# Patient Record
Sex: Female | Born: 1960 | Race: White | Hispanic: No | Marital: Married | Smoking: Never smoker
Health system: Southern US, Community
[De-identification: ages and names within clinical notes are randomized; demographics above are authoritative.]

## PROBLEM LIST (undated history)

## (undated) DIAGNOSIS — R7303 Prediabetes: Secondary | ICD-10-CM

## (undated) DIAGNOSIS — I10 Essential (primary) hypertension: Secondary | ICD-10-CM

## (undated) DIAGNOSIS — E079 Disorder of thyroid, unspecified: Secondary | ICD-10-CM

## (undated) DIAGNOSIS — E785 Hyperlipidemia, unspecified: Secondary | ICD-10-CM

## (undated) DIAGNOSIS — H409 Unspecified glaucoma: Secondary | ICD-10-CM

## (undated) DIAGNOSIS — I2699 Other pulmonary embolism without acute cor pulmonale: Secondary | ICD-10-CM

## (undated) DIAGNOSIS — F419 Anxiety disorder, unspecified: Secondary | ICD-10-CM

## (undated) DIAGNOSIS — K219 Gastro-esophageal reflux disease without esophagitis: Secondary | ICD-10-CM

## (undated) DIAGNOSIS — Z8719 Personal history of other diseases of the digestive system: Secondary | ICD-10-CM

## (undated) DIAGNOSIS — F429 Obsessive-compulsive disorder, unspecified: Secondary | ICD-10-CM

## (undated) DIAGNOSIS — E039 Hypothyroidism, unspecified: Secondary | ICD-10-CM

## (undated) DIAGNOSIS — R1310 Dysphagia, unspecified: Secondary | ICD-10-CM

## (undated) HISTORY — DX: Anxiety disorder, unspecified: F41.9

## (undated) HISTORY — PX: CAROTID ENDARTERECTOMY: SUR193

## (undated) HISTORY — PX: APPENDECTOMY: SHX54

## (undated) HISTORY — DX: Disorder of thyroid, unspecified: E07.9

## (undated) HISTORY — DX: Other pulmonary embolism without acute cor pulmonale: I26.99

## (undated) HISTORY — PX: TUBAL LIGATION: SHX77

## (undated) HISTORY — PX: DILATION AND CURETTAGE OF UTERUS: SHX78

## (undated) HISTORY — PX: ABDOMINAL HYSTERECTOMY: SHX81

## (undated) HISTORY — PX: CHOLECYSTECTOMY: SHX55

---

## 2004-08-20 ENCOUNTER — Ambulatory Visit: Payer: Self-pay | Admitting: Unknown Physician Specialty

## 2004-08-26 ENCOUNTER — Ambulatory Visit: Payer: Self-pay | Admitting: Unknown Physician Specialty

## 2005-09-01 ENCOUNTER — Ambulatory Visit: Payer: Self-pay | Admitting: Unknown Physician Specialty

## 2006-09-05 ENCOUNTER — Ambulatory Visit: Payer: Self-pay | Admitting: Unknown Physician Specialty

## 2006-09-07 ENCOUNTER — Ambulatory Visit: Payer: Self-pay | Admitting: Unknown Physician Specialty

## 2006-09-12 ENCOUNTER — Ambulatory Visit: Payer: Self-pay | Admitting: Unknown Physician Specialty

## 2006-10-11 ENCOUNTER — Encounter: Payer: Self-pay | Admitting: Unknown Physician Specialty

## 2006-10-24 ENCOUNTER — Ambulatory Visit: Payer: Self-pay | Admitting: Unknown Physician Specialty

## 2006-11-08 ENCOUNTER — Encounter: Payer: Self-pay | Admitting: Unknown Physician Specialty

## 2007-03-06 ENCOUNTER — Ambulatory Visit: Payer: Self-pay | Admitting: Unknown Physician Specialty

## 2007-10-11 ENCOUNTER — Ambulatory Visit: Payer: Self-pay | Admitting: Unknown Physician Specialty

## 2008-10-23 ENCOUNTER — Ambulatory Visit: Payer: Self-pay | Admitting: Unknown Physician Specialty

## 2008-11-04 ENCOUNTER — Ambulatory Visit: Payer: Self-pay | Admitting: Unknown Physician Specialty

## 2008-11-06 ENCOUNTER — Ambulatory Visit: Payer: Self-pay | Admitting: Unknown Physician Specialty

## 2008-11-10 ENCOUNTER — Ambulatory Visit: Payer: Self-pay | Admitting: Unknown Physician Specialty

## 2008-11-11 ENCOUNTER — Ambulatory Visit: Payer: Self-pay | Admitting: Cardiology

## 2009-01-27 ENCOUNTER — Ambulatory Visit: Payer: Self-pay | Admitting: Internal Medicine

## 2009-01-29 ENCOUNTER — Ambulatory Visit: Payer: Self-pay | Admitting: Internal Medicine

## 2009-03-12 ENCOUNTER — Ambulatory Visit: Payer: Self-pay | Admitting: Internal Medicine

## 2009-05-16 ENCOUNTER — Emergency Department: Payer: Self-pay | Admitting: Emergency Medicine

## 2009-12-30 ENCOUNTER — Ambulatory Visit: Payer: Self-pay | Admitting: Unknown Physician Specialty

## 2010-01-07 ENCOUNTER — Ambulatory Visit: Payer: Self-pay | Admitting: Family Medicine

## 2010-08-25 ENCOUNTER — Emergency Department: Payer: Self-pay | Admitting: Emergency Medicine

## 2010-09-04 ENCOUNTER — Ambulatory Visit: Payer: Self-pay | Admitting: Sports Medicine

## 2010-09-07 ENCOUNTER — Encounter: Payer: Self-pay | Admitting: Sports Medicine

## 2010-09-08 ENCOUNTER — Encounter: Payer: Self-pay | Admitting: Sports Medicine

## 2011-01-13 ENCOUNTER — Ambulatory Visit: Payer: Self-pay | Admitting: Unknown Physician Specialty

## 2011-05-26 ENCOUNTER — Ambulatory Visit: Payer: Self-pay | Admitting: Obstetrics and Gynecology

## 2011-12-01 ENCOUNTER — Other Ambulatory Visit: Payer: Self-pay | Admitting: Physician Assistant

## 2011-12-01 ENCOUNTER — Ambulatory Visit: Payer: Self-pay

## 2012-01-19 ENCOUNTER — Ambulatory Visit: Payer: Self-pay | Admitting: Obstetrics and Gynecology

## 2012-01-20 ENCOUNTER — Ambulatory Visit: Payer: Self-pay | Admitting: Obstetrics and Gynecology

## 2012-01-21 ENCOUNTER — Ambulatory Visit: Payer: Self-pay | Admitting: Unknown Physician Specialty

## 2012-04-04 ENCOUNTER — Ambulatory Visit: Payer: Self-pay | Admitting: Podiatry

## 2012-07-25 ENCOUNTER — Ambulatory Visit: Payer: Self-pay | Admitting: Obstetrics and Gynecology

## 2012-07-26 ENCOUNTER — Ambulatory Visit: Payer: Self-pay | Admitting: Internal Medicine

## 2013-05-10 ENCOUNTER — Ambulatory Visit: Payer: Self-pay | Admitting: Unknown Physician Specialty

## 2013-06-14 ENCOUNTER — Ambulatory Visit: Payer: Self-pay | Admitting: Internal Medicine

## 2013-06-26 ENCOUNTER — Emergency Department: Payer: Self-pay | Admitting: Emergency Medicine

## 2013-06-26 LAB — BASIC METABOLIC PANEL
ANION GAP: 6 — AB (ref 7–16)
BUN: 14 mg/dL (ref 7–18)
CO2: 27 mmol/L (ref 21–32)
Calcium, Total: 8.7 mg/dL (ref 8.5–10.1)
Chloride: 107 mmol/L (ref 98–107)
Creatinine: 0.98 mg/dL (ref 0.60–1.30)
EGFR (African American): >60
EGFR (Non-African Amer.): >60
Glucose: 133 mg/dL — ABNORMAL HIGH (ref 65–99)
Osmolality: 282 (ref 275–301)
POTASSIUM: 3.8 mmol/L (ref 3.5–5.1)
Sodium: 140 mmol/L (ref 136–145)

## 2013-06-26 LAB — CBC
HCT: 38.3 % (ref 35.0–47.0)
HGB: 12.6 g/dL (ref 12.0–16.0)
MCH: 30.1 pg (ref 26.0–34.0)
MCHC: 32.8 g/dL (ref 32.0–36.0)
MCV: 92 fL (ref 80–100)
Platelet: 241 10*3/uL (ref 150–440)
RBC: 4.18 10*6/uL (ref 3.80–5.20)
RDW: 14 % (ref 11.5–14.5)
WBC: 11 10*3/uL (ref 3.6–11.0)

## 2013-06-26 LAB — PROTIME-INR
INR: 0.9
Prothrombin Time: 12.5 secs (ref 11.5–14.7)

## 2013-06-26 LAB — D-DIMER(ARMC): D-Dimer: 664 ng/ml

## 2013-06-26 LAB — TROPONIN I

## 2013-06-27 LAB — TROPONIN I: Troponin-I: <0.02 ng/mL

## 2013-07-12 ENCOUNTER — Ambulatory Visit: Payer: Self-pay | Admitting: Internal Medicine

## 2013-09-19 DIAGNOSIS — G4733 Obstructive sleep apnea (adult) (pediatric): Secondary | ICD-10-CM | POA: Insufficient documentation

## 2013-11-01 ENCOUNTER — Ambulatory Visit: Payer: Self-pay | Admitting: Obstetrics and Gynecology

## 2013-11-13 ENCOUNTER — Ambulatory Visit: Payer: Self-pay | Admitting: Internal Medicine

## 2013-11-27 ENCOUNTER — Ambulatory Visit: Payer: Self-pay | Admitting: Internal Medicine

## 2013-12-08 ENCOUNTER — Ambulatory Visit: Payer: Self-pay | Admitting: Internal Medicine

## 2014-06-02 IMAGING — CT CT ANGIO CHEST
2 of 6 series · 18 of 36 positions shown · IV contrast (isovue)
Comparison: DG CHEST 2V dated 06/26/2013; CT CHEST W/ CM dated
12/01/2011

CLINICAL DATA: Shortness of breath, elevated D-dimer

EXAM:
CT ANGIOGRAPHY CHEST WITH CONTRAST
TECHNIQUE: Multidetector CT imaging of the chest was performed using the
standard protocol during bolus administration of intravenous
contrast. Multiplanar CT image reconstructions and MIPs were
obtained to evaluate the vascular anatomy.
CONTRAST:  100 mL Isovue 370

[Series 7: cor pe 2.0 mpr · coronal · 0.61mm/px · 1 of 93 slices shown]
[im 47/93  mediastinal]
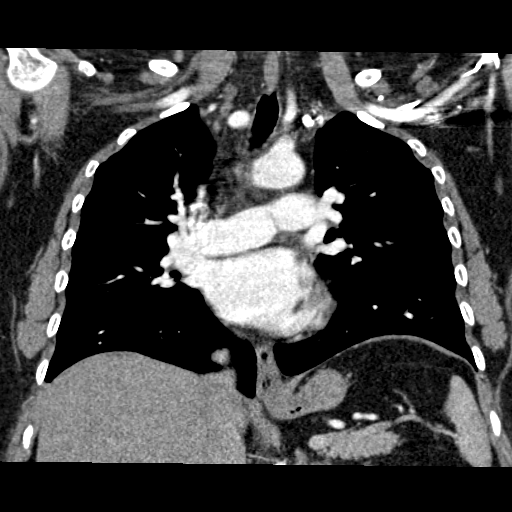

[Series 11: pe 1.0 thins - · axial · 0.64mm/px · z∈[-299,-75]mm · 17 of 253 slices shown]
[im 15/253  lung]
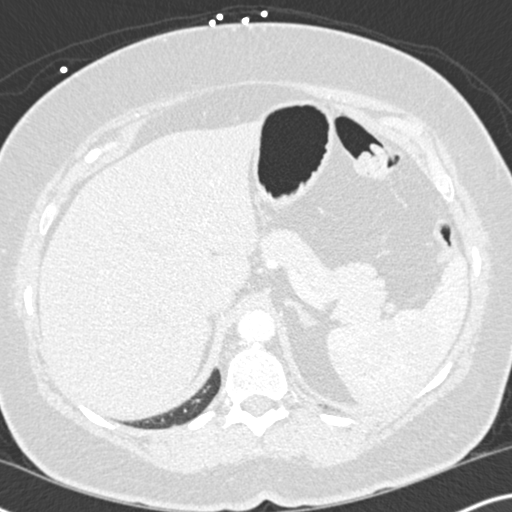
[im 29/253  mediastinal]
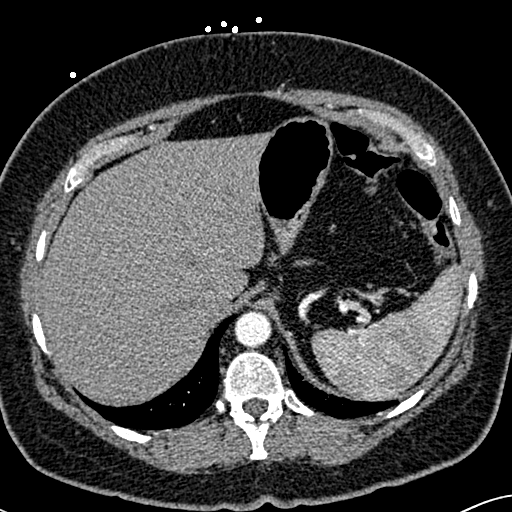
[im 43/253  lung]
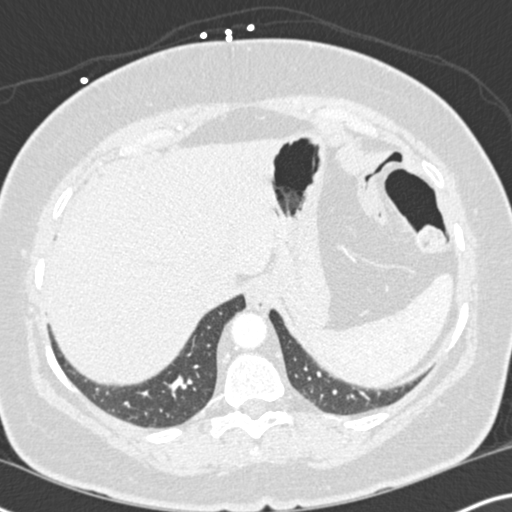
[im 57/253  mediastinal]
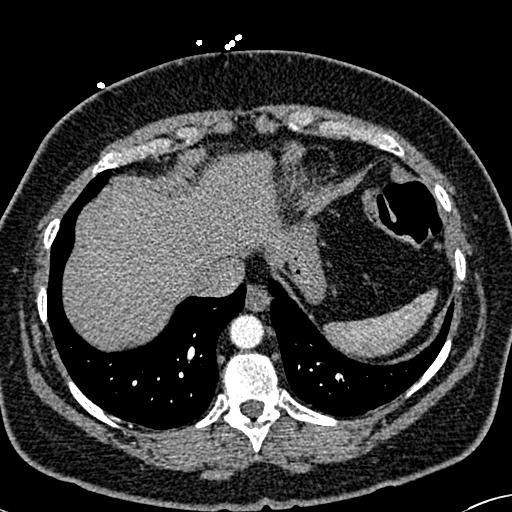
[im 71/253  lung]
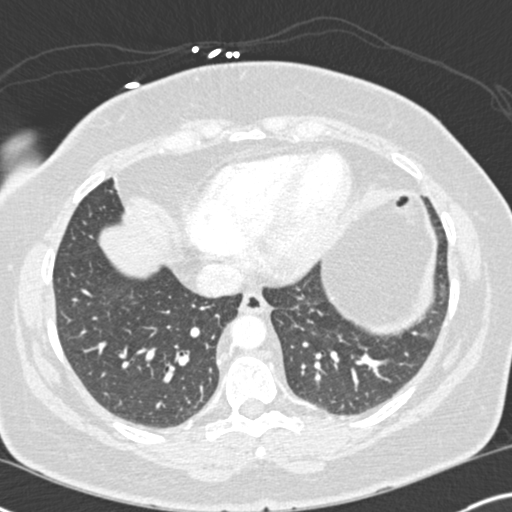
[im 85/253  mediastinal]
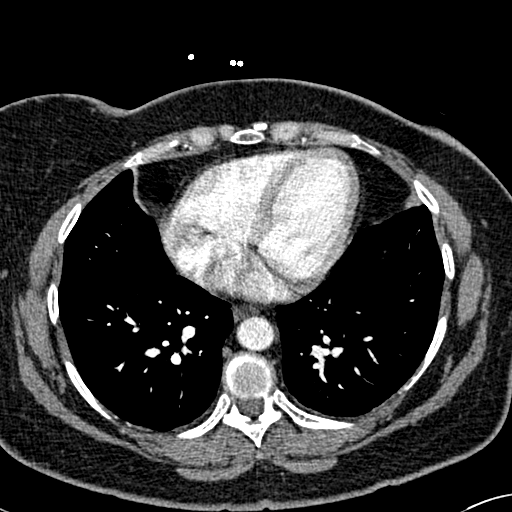
[im 99/253  lung]
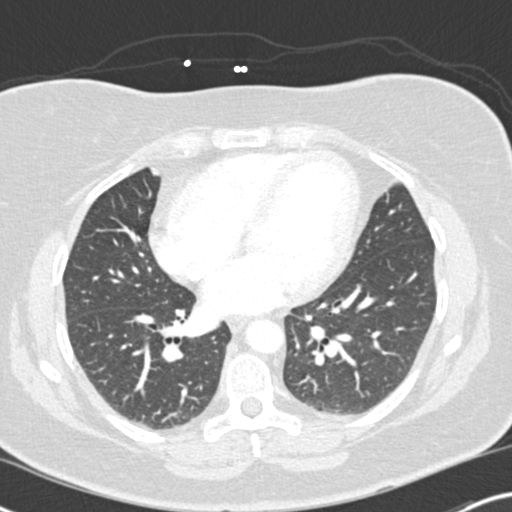
[im 113/253  mediastinal]
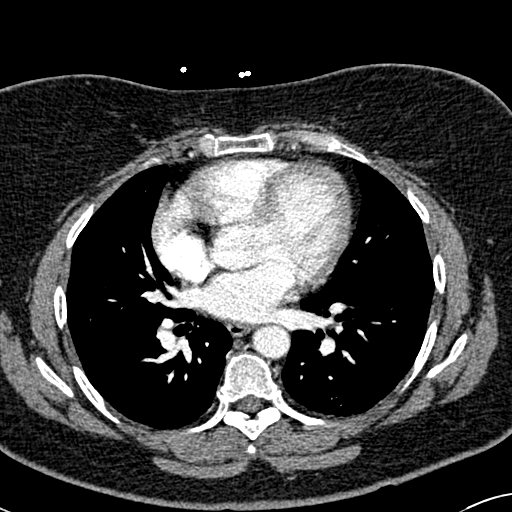
[im 127/253  lung]
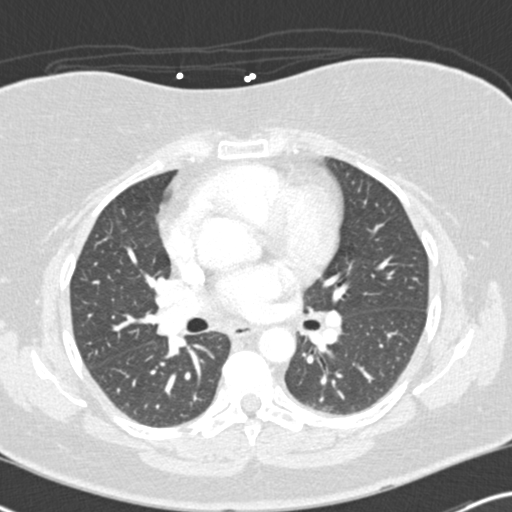
[im 141/253  mediastinal]
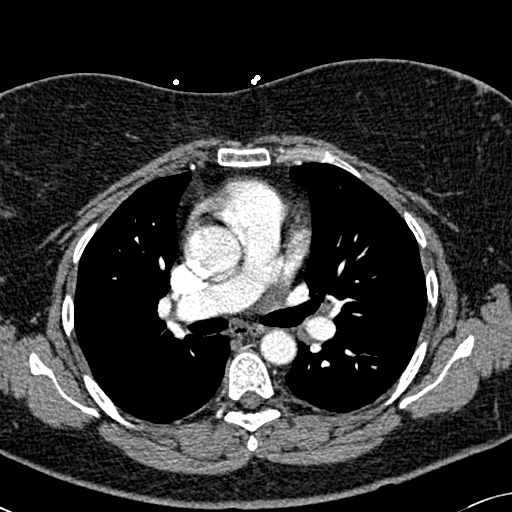
[im 155/253  lung]
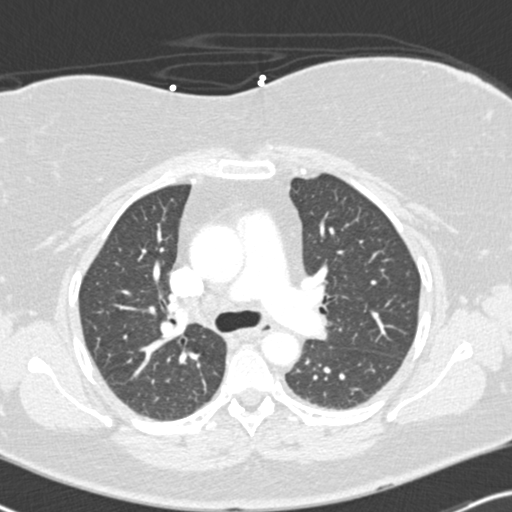
[im 169/253  mediastinal]
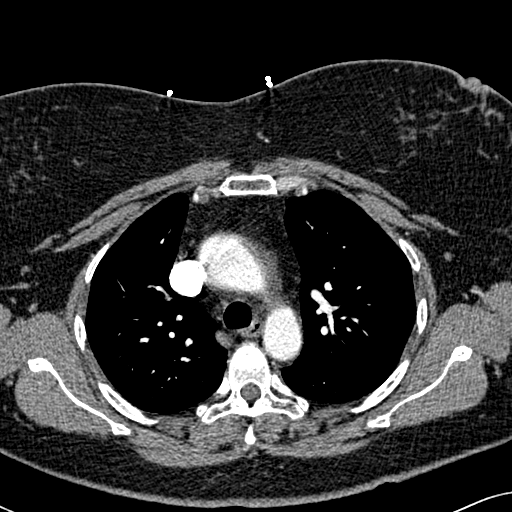
[im 183/253  lung]
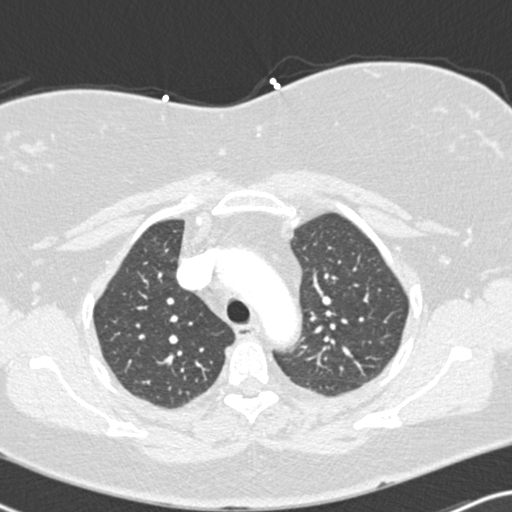
[im 197/253  mediastinal]
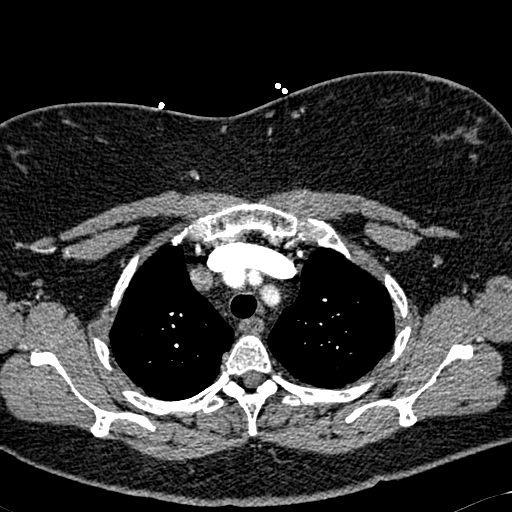
[im 211/253  lung]
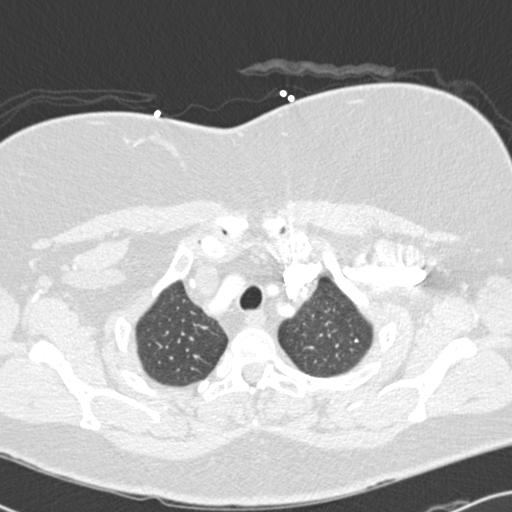
[im 225/253  mediastinal]
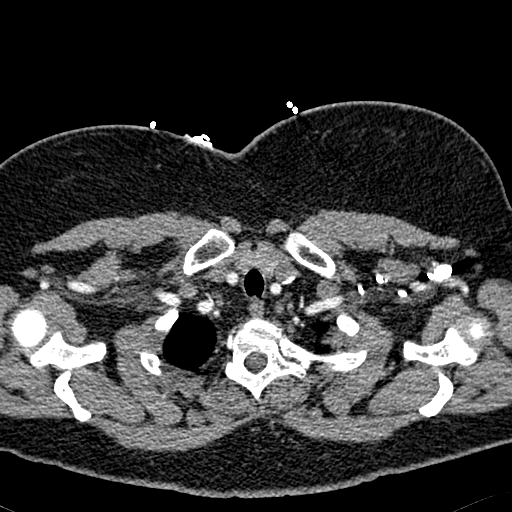
[im 239/253  lung]
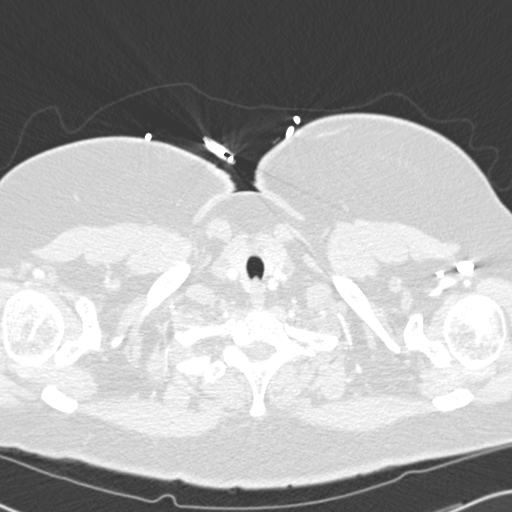

[18 of 36 positions shown; findings below may reference images not displayed]

FINDINGS: The thoracic inlet is unremarkable.

No mediastinal or hilar masses or adenopathy.

There no filling defects within the main, lobar, or segmental
pulmonary arteries. There is no evidence of a thoracic aortic
aneurysm nor dissection.

Minimal scarring versus atelectasis within the lung bases. The lungs
otherwise clear. The central airways are patent.

The visualized upper abdominal viscera are unremarkable.

No aggressive appearing osseous lesions.

Review of the MIP images confirms the above findings.
IMPRESSION: 1. No CT evidence of pulmonary arterial embolic disease.
2. No focal or acute intrathoracic abnormalities.

## 2014-06-11 ENCOUNTER — Other Ambulatory Visit: Payer: Self-pay | Admitting: Unknown Physician Specialty

## 2014-06-11 DIAGNOSIS — R1012 Left upper quadrant pain: Secondary | ICD-10-CM

## 2014-06-11 DIAGNOSIS — K5900 Constipation, unspecified: Secondary | ICD-10-CM

## 2014-06-12 ENCOUNTER — Ambulatory Visit
Admission: RE | Admit: 2014-06-12 | Discharge: 2014-06-12 | Disposition: A | Discharge status: Home / Self Care | Payer: Managed Care, Other (non HMO) | Source: Ambulatory Visit | Attending: Unknown Physician Specialty | Admitting: Unknown Physician Specialty

## 2014-06-12 DIAGNOSIS — K5900 Constipation, unspecified: Secondary | ICD-10-CM

## 2014-06-12 DIAGNOSIS — R1012 Left upper quadrant pain: Secondary | ICD-10-CM | POA: Insufficient documentation

## 2014-06-12 MED ORDER — IOHEXOL 300 MG/ML  SOLN
100.0000 mL | Freq: Once | INTRAMUSCULAR | Status: AC | PRN
  Administered 2014-06-12: 100 mL via INTRAVENOUS

## 2014-07-02 ENCOUNTER — Encounter
Admission: RE | Disposition: A | Discharge status: Home / Self Care | Payer: Self-pay | Source: Ambulatory Visit | Attending: Unknown Physician Specialty

## 2014-07-02 ENCOUNTER — Ambulatory Visit: Payer: Managed Care, Other (non HMO) | Admitting: Anesthesiology

## 2014-07-02 ENCOUNTER — Encounter: Payer: Self-pay | Admitting: *Deleted

## 2014-07-02 ENCOUNTER — Ambulatory Visit
Admission: RE | Admit: 2014-07-02 | Discharge: 2014-07-02 | Disposition: A | Discharge status: Home / Self Care | Payer: Managed Care, Other (non HMO) | Source: Ambulatory Visit | Attending: Unknown Physician Specialty | Admitting: Unknown Physician Specialty

## 2014-07-02 DIAGNOSIS — Z800 Family history of malignant neoplasm of digestive organs: Secondary | ICD-10-CM | POA: Diagnosis not present

## 2014-07-02 DIAGNOSIS — Z7982 Long term (current) use of aspirin: Secondary | ICD-10-CM | POA: Insufficient documentation

## 2014-07-02 DIAGNOSIS — Z1211 Encounter for screening for malignant neoplasm of colon: Secondary | ICD-10-CM | POA: Diagnosis present

## 2014-07-02 DIAGNOSIS — R1012 Left upper quadrant pain: Secondary | ICD-10-CM | POA: Insufficient documentation

## 2014-07-02 DIAGNOSIS — Z79899 Other long term (current) drug therapy: Secondary | ICD-10-CM | POA: Insufficient documentation

## 2014-07-02 DIAGNOSIS — K2970 Gastritis, unspecified, without bleeding: Secondary | ICD-10-CM | POA: Insufficient documentation

## 2014-07-02 DIAGNOSIS — K648 Other hemorrhoids: Secondary | ICD-10-CM | POA: Insufficient documentation

## 2014-07-02 DIAGNOSIS — M797 Fibromyalgia: Secondary | ICD-10-CM | POA: Diagnosis not present

## 2014-07-02 HISTORY — PX: COLONOSCOPY: SHX5424

## 2014-07-02 HISTORY — PX: ESOPHAGOGASTRODUODENOSCOPY: SHX5428

## 2014-07-02 SURGERY — COLONOSCOPY
Anesthesia: General

## 2014-07-02 MED ORDER — PROPOFOL INFUSION 10 MG/ML OPTIME
INTRAVENOUS | Status: DC | PRN
  Administered 2014-07-02: 200 ug/kg/min via INTRAVENOUS

## 2014-07-02 MED ORDER — MIDAZOLAM HCL 5 MG/5ML IJ SOLN
INTRAMUSCULAR | Status: DC | PRN
  Administered 2014-07-02: 1 mg via INTRAVENOUS

## 2014-07-02 MED ORDER — SODIUM CHLORIDE 0.9 % IV SOLN: INTRAVENOUS | Status: DC

## 2014-07-02 MED ORDER — LIDOCAINE HCL (PF) 2 % IJ SOLN
INTRAMUSCULAR | Status: DC | PRN
  Administered 2014-07-02: 50 mg

## 2014-07-02 MED ORDER — LACTATED RINGERS IV SOLN
INTRAVENOUS | Status: DC
  Administered 2014-07-02: 1000 mL via INTRAVENOUS

## 2014-07-02 MED ORDER — LIDOCAINE HCL (PF) 1 % IJ SOLN
INTRAMUSCULAR | Status: AC
Start: 2014-07-02 — End: 2014-07-02
  Administered 2014-07-02: 2 mL
  Filled 2014-07-02: qty 2

## 2014-07-02 MED ORDER — FENTANYL CITRATE (PF) 100 MCG/2ML IJ SOLN
INTRAMUSCULAR | Status: DC | PRN
  Administered 2014-07-02: 50 ug via INTRAVENOUS

## 2014-07-02 MED ORDER — PHENYLEPHRINE HCL 10 MG/ML IJ SOLN
INTRAMUSCULAR | Status: DC | PRN
  Administered 2014-07-02 (×2): 100 ug via INTRAVENOUS
  Administered 2014-07-02: 200 ug via INTRAVENOUS

## 2014-07-02 MED ORDER — GLYCOPYRROLATE 0.2 MG/ML IJ SOLN
INTRAMUSCULAR | Status: DC | PRN
  Administered 2014-07-02: 0.2 mg via INTRAVENOUS

## 2014-07-02 MED ORDER — PROPOFOL 10 MG/ML IV BOLUS
INTRAVENOUS | Status: DC | PRN
  Administered 2014-07-02: 50 mg via INTRAVENOUS

## 2014-07-02 NOTE — H&P (Signed)
   Primary Care Physician:  Marguarite ArbourSPARKS,JEFFREY D, MD Primary Gastroenterologist:  Dr. Mechele CollinElliott  Pre-Procedure History & Physical: HPI:  Sydney Soto is a 54 y.o. female is here for an endoscopy and colonoscopy.   History reviewed. No pertinent past medical history.  History reviewed. No pertinent past surgical history.  Prior to Admission medications   Medication Sig Start Date End Date Taking? Authorizing Provider  acetaminophen (TYLENOL) 500 MG tablet Take 1,000 mg by mouth every 6 (six) hours as needed for moderate pain.   Yes Historical Provider, MD  aspirin 325 MG tablet Take 325 mg by mouth daily.   Yes Historical Provider, MD  cyanocobalamin (,VITAMIN B-12,) 1000 MCG/ML injection Inject 1 mL into the muscle every 30 (thirty) days. 06/11/14  Yes Historical Provider, MD  DULoxetine (CYMBALTA) 30 MG capsule Take 30-60 mg by mouth 2 (two) times daily. 2 cap in the morning and 1 cap at night   Yes Historical Provider, MD  magnesium oxide (MAG-OX) 400 MG tablet Take 400 mg by mouth daily.   Yes Historical Provider, MD  naproxen sodium (ANAPROX) 220 MG tablet Take 220 mg by mouth 2 (two) times daily as needed (pain).   Yes Historical Provider, MD  pregabalin (LYRICA) 50 MG capsule Take 1 capsule by mouth at bedtime. 03/17/14  Yes Historical Provider, MD  TURMERIC PO Take 1 tablet by mouth daily.   Yes Historical Provider, MD  Vitamin D, Ergocalciferol, (DRISDOL) 50000 UNITS CAPS capsule Take 5,000 Units by mouth daily.   Yes Historical Provider, MD    Allergies as of 06/23/2014 - Review Complete 06/12/2014  Allergen Reaction Noted  . Erythromycin Nausea Only 06/12/2014    History reviewed. No pertinent family history. Family history of colon cancer in first degree relative  History   Social History  . Marital Status: Married    Spouse Name: N/A  . Number of Children: N/A  . Years of Education: N/A   Occupational History  . Not on file.   Social History Main Topics  . Smoking  status: Not on file  . Smokeless tobacco: Not on file  . Alcohol Use: Not on file  . Drug Use: Not on file  . Sexual Activity: Not on file   Other Topics Concern  . Not on file   Social History Narrative    Review of Systems: See HPI, otherwise negative ROS  Physical Exam: BP 133/85 mmHg  Pulse 86  Temp(Src) 97.1 F (36.2 C) (Tympanic)  Resp 18  Ht 5\' 5"  (1.651 m)  Wt 100.699 kg (222 lb)  BMI 36.94 kg/m2 General:   Alert,  pleasant and cooperative in NAD Head:  Normocephalic and atraumatic. Neck:  Supple; no masses or thyromegaly. Lungs:  Clear throughout to auscultation.    Heart:  Regular rate and rhythm. Abdomen:  Soft, nontender and nondistended. Normal bowel sounds, without guarding, and without rebound.   Neurologic:  Alert and  oriented x4;  grossly normal neurologically.  Impression/Plan: Sydney Soto is here for an endoscopy and colonoscopy to be performed Abdominal pain and family history of colon cancer  Risks, benefits, limitations, and alternatives regarding  endoscopy and colonoscopy have been reviewed with the patient.  Questions have been answered.  All parties agreeable.   Lynnae PrudeELLIOTT, Onie Kasparek, MD  07/02/2014, 1:30 PM

## 2014-07-02 NOTE — Transfer of Care (Signed)
Immediate Anesthesia Transfer of Care Note  Patient: Sydney Soto CanaryH Groman  Procedure(s) Performed: Procedure(s): COLONOSCOPY (N/A) ESOPHAGOGASTRODUODENOSCOPY (EGD) (N/A)  Patient Location: PACU  Anesthesia Type:General  Level of Consciousness: sedated  Airway & Oxygen Therapy: Patient Spontanous Breathing and Patient connected to nasal cannula oxygen  Post-op Assessment: Report given to RN  Post vital signs: Reviewed and stable  Last Vitals:  Filed Vitals:   07/02/14 1250  BP: 133/85  Pulse: 86  Temp: 36.2 C  Resp: 18    Complications: No apparent anesthesia complications

## 2014-07-02 NOTE — Op Note (Signed)
[facility] Gastroenterology Patient Name: Sydney SavageCatherene Soto Procedure Date: 07/02/2014 12:57 PM MRN: [id number] Account #: [id number] Date of Birth: August 17, 1960 Admit Type: Outpatient Age: [age] [facility] 3 Gender: Female Note Status: Finalized Procedure:         Upper GI endoscopy Indications:       Abdominal pain in the left upper quadrant Providers:         Scot Junobert T. Elliott, MD Referring MD:      Duane LopeJeffrey D. Judithann SheenSparks, MD (Referring MD) Medicines:         Propofol per Anesthesia Complications:     No immediate complications. Procedure:         Pre-Anesthesia Assessment:                    - After reviewing the risks and benefits, the patient was                     deemed in satisfactory condition to undergo the procedure.                    After obtaining informed consent, the endoscope was passed                     under direct vision. Throughout the procedure, the                     patient's blood pressure, pulse, and oxygen saturations                     were monitored continuously. The Endoscope was introduced                     through the mouth, and advanced to the second part of                     duodenum. The upper GI endoscopy was accomplished without                     difficulty. The patient tolerated the procedure well. Findings:      The examined esophagus was normal.      Patchy mildly erythematous mucosa without bleeding was found in the       gastric antrum. Biopsies were taken with a cold forceps for histology.       also from the body.      Diffuse mild inflammation characterized by erythema and granularity was       found in the duodenal bulb. Biopsies were taken with a cold forceps for       histology. Biopsies for histology were taken with a cold forceps for for       evaluation of celiac disease. Impression:        - Normal esophagus.                    - Erythematous mucosa in the antrum. Biopsied.                    -  Duodenitis. Biopsied. Recommendation:    - Await pathology results. Scot Junobert T Elliott, MD 07/02/2014 1:48:21 PM This report has been signed electronically. Number of Addenda: 0 Note Initiated On: 07/02/2014 12:57 PM      [facility]

## 2014-07-02 NOTE — Anesthesia Preprocedure Evaluation (Addendum)
Anesthesia Evaluation  Patient identified by MRN, date of birth, ID band Patient awake    Reviewed: Allergy & Precautions, NPO status , Patient's Chart, lab work & pertinent test results  Airway Mallampati: III       Dental no notable dental hx. (+) Teeth Intact   Pulmonary neg pulmonary ROS,    Pulmonary exam normal       Cardiovascular negative cardio ROS  Rate:Tachycardia     Neuro/Psych Anxiety negative neurological ROS  negative psych ROS   GI/Hepatic negative GI ROS, Neg liver ROS,   Endo/Other  Morbid obesity  Renal/GU negative Renal ROS  negative genitourinary   Musculoskeletal negative musculoskeletal ROS (+)   Abdominal Normal abdominal exam  (+)   Peds negative pediatric ROS (+)  Hematology   Anesthesia Other Findings   Reproductive/Obstetrics negative OB ROS                            Anesthesia Physical Anesthesia Plan  ASA: III  Anesthesia Plan: General   Post-op Pain Management:    Induction: Intravenous  Airway Management Planned: Nasal Cannula  Additional Equipment:   Intra-op Plan:   Post-operative Plan:   Informed Consent: I have reviewed the patients History and Physical, chart, labs and discussed the procedure including the risks, benefits and alternatives for the proposed anesthesia with the patient or authorized representative who has indicated his/her understanding and acceptance.     Plan Discussed with: CRNA  Anesthesia Plan Comments:         Anesthesia Quick Evaluation

## 2014-07-02 NOTE — Anesthesia Postprocedure Evaluation (Signed)
  Anesthesia Post-op Note  Patient: Sydney Soto  Procedure(s) Performed: Procedure(s): COLONOSCOPY (N/A) ESOPHAGOGASTRODUODENOSCOPY (EGD) (N/A)  Anesthesia type:General  Patient location: PACU  Post pain: Pain level controlled  Post assessment: Post-op Vital signs reviewed, Patient's Cardiovascular Status Stable, Respiratory Function Stable, Patent Airway and No signs of Nausea or vomiting  Post vital signs: Reviewed and stable  Last Vitals:  Filed Vitals:   07/02/14 1250  BP: 133/85  Pulse: 86  Temp: 36.2 C  Resp: 18    Level of consciousness: awake, alert  and patient cooperative  Complications: No apparent anesthesia complications

## 2014-07-02 NOTE — Op Note (Signed)
[facility] Gastroenterology Patient Name: Sydney Soto Procedure Date: 07/02/2014 12:56 PM MRN: [id number] Account #: [id number] Date of Birth: 11-09-1960 Admit Type: Outpatient Age: 54 Room: [facility] ENDO ROOM 3 Gender: Female Note Status: Finalized Procedure:         Colonoscopy Indications:       Screening in patient at increased risk: Family history of                     1st-degree relative with colorectal cancer before age 34                     years Providers:         Scot Jun, MD Referring MD:      Duane Lope. Judithann Sheen, MD (Referring MD) Medicines:         Propofol per Anesthesia Complications:     No immediate complications. Procedure:         Pre-Anesthesia Assessment:                    - After reviewing the risks and benefits, the patient was                     deemed in satisfactory condition to undergo the procedure.                    After obtaining informed consent, the colonoscope was                     passed under direct vision. Throughout the procedure, the                     patient's blood pressure, pulse, and oxygen saturations                     were monitored continuously. The Olympus PCF-H180AL                     colonoscope ( S#: [id number] ) was introduced through the                     anus and advanced to the the cecum, identified by                     appendiceal orifice and ileocecal valve. The colonoscopy                     was performed without difficulty. The patient tolerated                     the procedure well. The quality of the bowel preparation                     was excellent. Findings:      Internal hemorrhoids were found during endoscopy. The hemorrhoids were       small. Prep was normal.      The exam was otherwise without abnormality. Impression:        - Internal hemorrhoids.                    - The examination was otherwise normal.                    - No specimens collected. Recommendation:     - Repeat colonoscopy  in 5 years for screening purposes. Scot Junobert T Devontre Siedschlag, MD 07/02/2014 2:11:06 PM This report has been signed electronically. Number of Addenda: 0 Note Initiated On: 07/02/2014 12:56 PM Scope Withdrawal Time: 0 hours 15 minutes 22 seconds  Total Procedure Duration: 0 hours 18 minutes 9 seconds       [facility]

## 2014-07-03 ENCOUNTER — Encounter: Payer: Self-pay | Admitting: Unknown Physician Specialty

## 2014-07-08 ENCOUNTER — Other Ambulatory Visit: Payer: Self-pay | Admitting: Obstetrics and Gynecology

## 2014-07-08 DIAGNOSIS — N63 Unspecified lump in unspecified breast: Secondary | ICD-10-CM

## 2014-07-16 ENCOUNTER — Ambulatory Visit
Admission: RE | Admit: 2014-07-16 | Discharge: 2014-07-16 | Disposition: A | Discharge status: Home / Self Care | Payer: Managed Care, Other (non HMO) | Source: Ambulatory Visit | Attending: Obstetrics and Gynecology | Admitting: Obstetrics and Gynecology

## 2014-07-16 ENCOUNTER — Other Ambulatory Visit: Payer: Self-pay | Admitting: Obstetrics and Gynecology

## 2014-07-16 ENCOUNTER — Ambulatory Visit: Payer: Managed Care, Other (non HMO)

## 2014-07-16 DIAGNOSIS — R928 Other abnormal and inconclusive findings on diagnostic imaging of breast: Secondary | ICD-10-CM | POA: Diagnosis not present

## 2014-07-16 DIAGNOSIS — N63 Unspecified lump in unspecified breast: Secondary | ICD-10-CM

## 2014-08-07 LAB — SURGICAL PATHOLOGY

## 2014-11-17 ENCOUNTER — Other Ambulatory Visit: Payer: Self-pay | Admitting: Internal Medicine

## 2014-11-17 DIAGNOSIS — M5441 Lumbago with sciatica, right side: Secondary | ICD-10-CM

## 2014-11-25 ENCOUNTER — Ambulatory Visit: Payer: Managed Care, Other (non HMO)

## 2014-11-27 ENCOUNTER — Ambulatory Visit
Admission: RE | Admit: 2014-11-27 | Discharge: 2014-11-27 | Disposition: A | Discharge status: Home / Self Care | Payer: Managed Care, Other (non HMO) | Source: Ambulatory Visit | Attending: Internal Medicine | Admitting: Internal Medicine

## 2014-11-27 DIAGNOSIS — M5441 Lumbago with sciatica, right side: Secondary | ICD-10-CM | POA: Diagnosis present

## 2014-11-27 DIAGNOSIS — M5127 Other intervertebral disc displacement, lumbosacral region: Secondary | ICD-10-CM | POA: Insufficient documentation

## 2015-01-09 ENCOUNTER — Ambulatory Visit: Payer: Managed Care, Other (non HMO) | Attending: Physical Medicine and Rehabilitation | Admitting: Physical Therapy

## 2015-01-09 ENCOUNTER — Encounter: Payer: Self-pay | Admitting: Physical Therapy

## 2015-01-09 DIAGNOSIS — M545 Low back pain: Secondary | ICD-10-CM | POA: Insufficient documentation

## 2015-01-09 DIAGNOSIS — M6248 Contracture of muscle, other site: Secondary | ICD-10-CM | POA: Insufficient documentation

## 2015-01-09 DIAGNOSIS — M62830 Muscle spasm of back: Secondary | ICD-10-CM | POA: Insufficient documentation

## 2015-01-09 DIAGNOSIS — M62838 Other muscle spasm: Secondary | ICD-10-CM

## 2015-01-09 DIAGNOSIS — M542 Cervicalgia: Secondary | ICD-10-CM

## 2015-01-10 NOTE — Therapy (Signed)
[facility]INE [address] [place], [address] Phone: [phone]   Fax:  [phone]  Physical Therapy Evaluation  Patient Details  Name: AUTUMM HATTERY MRN: [id number] Date of Birth: 07/03/1960 Referring Provider: Michael Boston DO  Encounter Date: 01/09/2015      PT End of Session - 01/09/15 1144    Visit Number 1   Number of Visits 8   Date for PT Re-Evaluation 02/07/15   PT Start Time 1055   PT Stop Time 1145   PT Time Calculation (min) 50 min   Activity Tolerance Patient tolerated treatment well   Behavior During Therapy [facility] for tasks assessed/performed      History reviewed. No pertinent past medical history.  Past Surgical History  Procedure Laterality Date  . Colonoscopy N/A 07/02/2014    Procedure: COLONOSCOPY;  Surgeon: Scot Jun, MD;  Location: [facility] ENDOSCOPY;  Service: Endoscopy;  Laterality: N/A;  . Esophagogastroduodenoscopy N/A 07/02/2014    Procedure: ESOPHAGOGASTRODUODENOSCOPY (EGD);  Surgeon: Scot Jun, MD;  Location: [facility] ENDOSCOPY;  Service: Endoscopy;  Laterality: N/A;    There were no vitals filed for this visit.  Visit Diagnosis:  Bilateral low back pain, with sciatica presence unspecified  Cervicalgia  Spasm of muscle, back  Muscle spasms of neck      Subjective Assessment - 01/09/15 1114    Subjective Paitent reports her right side of neck and back is tight and painful. She also reports that she has had 2 episodes of left LE shooting pain in the past month.    Pertinent History Patient reports a 2 year history of pain that began for no apparent reason in right lateral/anterior hip region. She has had injections in back with some results and is now referred to out patient physical therapy for back and neck pain with spasms as chief complaint.    How long can you sit comfortably? 1 hour   How long can you stand comfortably? varies maybe up to 30  min. (doing dishes sends her into muscle spasms)   How long can you walk comfortably? up to 30 min. and then begins to have spasms   Diagnostic tests MRI lumbar spine, none on neck   Patient Stated Goals able to sit and stand and do household chores without going into spasms   Currently in Pain? 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  No   Is the patient reluctant to leave their home because of a fear of falling?  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, able to get up and coordinate meetings for lunch: lifting, bending average 4/week    Leisure walking, (had surgery on left foot a year ago and is now beginning to be able to walk more, ride a bike    Cognition   Overall Cognitive Status Within Functional Limits for tasks assessed       Objective: AROM: lumbar spine flexion WFL's, extension minimal limitations with increased right side LBP and upper gluteal pain, LF to right and left WFL with increased pull on right side lower back with LF to left and increased pain with right LF, Cervical spine extension 40, flexion 65, lateral flexion left 35, right 50 with reported increased pull on right with lateral flexion to left Palpation: + moderate decreased soft tissue mobility along entire spine bilaterally with + spasm and TrP in right upper trapezius, along thoracic and lumbar spine and gluteal muscles Strength: grossly tested both UE's and LE's WFL's major muscle groups Outcome measures: NDI 38%, Modified Oswestry 40%   Treatment: Educated in posture awareness for sitting/standing and performed electrical stimulation: PT applied electrodes to right and left upper trapezius, along bilateral thoracic/lumbar spine muscles with patient seated in massage chair with High volt estim. Muscle spasm application with continuous mode to decrease spasms and pain  Patient response to treatment: decreased palpable spasms by 25% with improved soft tissue elasticity and reported decreased pain by patient to 3/10 in right upper trapezius and back muscles         PT Education - 01/09/15 1123    Education provided Yes   Education Details educated in posture awareness and plan for therapy to control/decrease spasms and imrpove core control and strength to alleviate symptoms   Person(s) Educated Patient   Methods Explanation   Comprehension Verbalized understanding             PT Long Term Goals - 01/09/15 1200    PT LONG TERM GOAL #1   Title Patient will be independent with home program for pain control, posture awareness and progressive exercises without cuing by 02/07/2015 in order to improve function and self manage symptoms for back and neck pain   Baseline Patient has decreased posture awareness/limited knowledge of  appropriate pain control strategies and progression of exercises to improve core control/strength and improve function for daily tasks   Status New   PT LONG TERM GOAL #2   Title Patient will demonstrate improved pain level and function wtih daily tasks for personal care, household and work related tasks as demonstrated by [employer] score of 25% or better by 02/07/2015   Baseline Modified Oswestry score = 40%   Status New   PT LONG TERM GOAL #3   Title Pateint will demonstrate improved self perceived disabiltiy due to neck symptoms with improved function with daily tasks at work and home by NDI score of 25% or less by 02/07/2015   Baseline NDI = 38%   Status New               Plan - 01/09/15 1155    Clinical Impression Statement Patient is  a 54 year old female who presents with chronic neck and back pain that is limiting her abiltiy to perform daily tasks with personal care, household chores and work related tasks. She presents with spasms along right side cervical spine, upper trapezius and along entire right side of thoracic and lumbar spine with limitations of ROM and strength in core cotnrol. Her modified Oswestry score for low back pain disability is 40% and NDI is 38%. She has limited knowledge of appropriate pain control strategies and progression of exercises to decrease spasms and improve function with daily tasks with less pain and difficulty and will therefore benefit from physical therapy intervention.     Pt will benefit from skilled therapeutic intervention in order to improve on the following deficits Pain;Decreased range of motion;Impaired flexibility;Decreased strength;Increased muscle spasms;Improper body mechanics   Rehab Potential Good   Clinical Impairments Affecting Rehab Potential (+) age, motivated  (-) chronic pain/condition   PT Frequency 2x / week   PT Duration 4 weeks   PT Treatment/Interventions Patient/family education;Ultrasound;Dry needling;Moist  Heat;Electrical Stimulation;Therapeutic exercise;Cryotherapy;Manual techniques   PT Next Visit Plan pain control with modalities, manual therapy techniques and progressive exercise for core control/strength and flexiblity   PT Home Exercise Plan posture control, awareness and progressive exercise   Consulted and Agree with Plan of Care Patient         Problem List There are no active problems to display for this patient.   [name], [name] PT 01/10/2015, 11:44 AM  [facility] [address] [place], [place], [address] Phone: [phone]   Fax:  [phone]  Name: Sharla KidneyCatherene H Auerbach MRN: [id number] Date of Birth: May 11, 1960

## 2015-01-13 ENCOUNTER — Ambulatory Visit: Payer: Managed Care, Other (non HMO) | Admitting: Physical Therapy

## 2015-01-13 ENCOUNTER — Encounter: Payer: Self-pay | Admitting: Physical Therapy

## 2015-01-13 DIAGNOSIS — M545 Low back pain: Secondary | ICD-10-CM

## 2015-01-13 DIAGNOSIS — M62830 Muscle spasm of back: Secondary | ICD-10-CM

## 2015-01-13 DIAGNOSIS — M62838 Other muscle spasm: Secondary | ICD-10-CM

## 2015-01-13 DIAGNOSIS — M542 Cervicalgia: Secondary | ICD-10-CM

## 2015-01-13 NOTE — Therapy (Signed)
[facility] [address], [place], [address] Phone: [phone]   Fax:  [phone]  Physical Therapy Treatment  Patient Details  Name: [name] MRN: [id number] Date of Birth: 21-Nov-1960 Referring Provider: Michael Boston DO  Encounter Date: 01/13/2015      PT End of Session - 01/13/15 1950    Visit Number 2   Number of Visits 8   Date for PT Re-Evaluation 02/07/15   PT Start Time 1900   PT Stop Time 1945   PT Time Calculation (min) 45 min   Activity Tolerance Patient tolerated treatment well   Behavior During Therapy [facility] for tasks assessed/performed      History reviewed. No pertinent past medical history.  Past Surgical History  Procedure Laterality Date  . Colonoscopy N/A 07/02/2014    Procedure: COLONOSCOPY;  Surgeon: Scot Jun, MD;  Location: [facility];  Service: Endoscopy;  Laterality: N/A;  . Esophagogastroduodenoscopy N/A 07/02/2014    Procedure: ESOPHAGOGASTRODUODENOSCOPY (EGD);  Surgeon: Scot Jun, MD;  Location: [facility];  Service: Endoscopy;  Laterality: N/A;    There were no vitals filed for this visit.  Visit Diagnosis:  Bilateral low back pain, with sciatica presence unspecified  Cervicalgia  Spasm of muscle, back  Muscle spasms of neck      Subjective Assessment - 01/13/15 1855    Subjective Paitent reports her right side of neck and back is tight and painful.  She reports she has increased neck pain today due to picking up a lantern at [facility] Saturday. She feels her neck is in spasms due to this and has difficulty with turning  head to right.     Patient Stated Goals able to sit and stand and do household chores without going into spasms   Currently in Pain? Yes   Pain Score --  neck 8/10, back spasms 7/10   Pain Location Back  and neck   Pain Orientation Other (Comment)  right side neck and back    Pain Descriptors /  Indicators Other (Comment)  spasms and tightness   Pain Type Chronic pain   Pain Onset More than a month ago      Objective Palpation: + spasm with TrPs along right side cervical spine and spasms along thoracic and lumbar spine right side AROM: decreased rotation to right cervical spine by 25%       [facility] Adult PT Treatment/Exercise - 01/13/15 2321    Modalities   Modalities Ultrasound   Ultrasound   Ultrasound Location cervical spine, thorackc spine and lumbar spine   Ultrasound Parameters static 0/8w/cm2 20% pulsed to cervical spine TrPs along right paraspinals,, continuous 1.3w/cm2  along paraspinal muscles thoracic and lumbar spine x 15 min total   Ultrasound Goals Pain;Other (Comment)  improve soft tissue elasticity   Manual Therapy   Manual Therapy Soft tissue mobilization;Joint mobilization   Joint Mobilization thoracic spine and lumbar spine PA mobilzation grade 2-3 glides x 3 reps    Soft tissue mobilization cervical spine to lumbar spine with patient sitting in massage chair     Exercise: performed with instruction, guidance, verbal cues and demonstration of PT: standing stabilization: scapular adduction and shoulder extension with resistive band to hips and cervical spine deep flexor strengthening with isometric holds  Patient response to treatment: decreased trigger point tenderness and spasms by 50% with improve soft tissue elasticity and joint mobility and decreased pain to 3-4/10, verbalized good  understanding of home exercises following demonstration and with verbal cues         PT Education - 01/13/15 1945    Education provided Yes   Education Details HEP for stabilizaiton in standing scapular adduction, shoulder extension to hips, deep neck flexor strengthening   Person(s) Educated Patient   Methods Explanation;Demonstration;Verbal cues   Comprehension Verbalized understanding;Returned demonstration;Verbal cues required             PT Long  Term Goals - 01/09/15 1200    PT LONG TERM GOAL #1   Title Patient will be independent with home program for pain control, posture awareness and progressive exercises without cuing by 02/07/2015 in order to improve function and self manage symptoms for back and neck pain   Baseline Patient has decreased posture awareness/limited knowledge of appropriate pain control strategies and progression of exercises to improve core control/strength and improve function for daily tasks   Status New   PT LONG TERM GOAL #2   Title Patient will demonstrate improved pain level and function wtih daily tasks for personal care, household and work related tasks as demonstrated by Pilgrim's PrideModified Oswestry score of 25% or better by 02/07/2015   Baseline Modified Oswestry score = 40%   Status New   PT LONG TERM GOAL #3   Title Pateint will demonstrate improved self perceived disabiltiy due to neck symptoms with improved function with daily tasks at work and home by NDI score of 25% or less by 02/07/2015   Baseline NDI = 38%   Status New               Plan - 01/13/15 2000    Clinical Impression Statement Patient demonstrates improved soft tissue elasticity with decreased pain along right side upper trapezius and along thoracic and lumbar spine with STM, US and demonstrated good understanding of home program for stabilizaiton and strengthening. She continues with spasms, pain and limitations with function due to pain and stiffness.    Pt will benefit from skilled therapeutic intervention in order to improve on the following deficits Pain;Decreased range of motion;Impaired flexibility;Decreased strength;Increased muscle spasms;Improper body mechanics   Rehab Potential Good   PT Frequency 2x / week   PT Duration 4 weeks   PT Treatment/Interventions Patient/family education;Ultrasound;Dry needling;Moist Heat;Electrical Stimulation;Therapeutic exercise;Cryotherapy;Manual techniques   PT Next Visit Plan pain control with  modalities, manual therapy techniques and progressive exercise for core control/strength and flexiblity   PT Home Exercise Plan stabilization in standing with resistive band for scapular adduciton and shoulder extension, cervical spine deep flexor strengthening        Problem List There are no active problems to display for this patient.   [name], Theodoro Koval PT 01/14/2015, 11:09 AM  [facility] [address] [place], [place], [address] Phone: [phone]   Fax:  [phone]  Name: Sharla KidneyCatherene H Briguglio MRN: [id number] Date of Birth: 04-Nov-1960

## 2015-01-15 ENCOUNTER — Encounter: Payer: Managed Care, Other (non HMO) | Admitting: Physical Therapy

## 2015-01-15 ENCOUNTER — Encounter: Payer: Self-pay | Admitting: Physical Therapy

## 2015-01-15 ENCOUNTER — Ambulatory Visit: Payer: Managed Care, Other (non HMO) | Admitting: Physical Therapy

## 2015-01-15 DIAGNOSIS — M545 Low back pain: Secondary | ICD-10-CM

## 2015-01-15 DIAGNOSIS — M542 Cervicalgia: Secondary | ICD-10-CM

## 2015-01-15 DIAGNOSIS — M62830 Muscle spasm of back: Secondary | ICD-10-CM

## 2015-01-15 DIAGNOSIS — M62838 Other muscle spasm: Secondary | ICD-10-CM

## 2015-01-16 NOTE — Therapy (Signed)
[facility] [address] [place], [place], [address] Phone: [phone]   Fax:  [phone]  Physical Therapy Treatment  Patient Details  Name: [facility] MRN: [id number] Date of Birth: Nov 13, 1960 Referring Provider: Michael Bostonhasnis, Benjamin C. DO  Encounter Date: 01/15/2015      PT End of Session - 01/15/15 1945    Visit Number 3   Number of Visits 8   Date for PT Re-Evaluation 02/07/15   PT Start Time 1904   PT Stop Time 1945   PT Time Calculation (min) 41 min   Activity Tolerance Patient tolerated treatment well   Behavior During Therapy [facility] for tasks assessed/performed      History reviewed. No pertinent past medical history.  Past Surgical History  Procedure Laterality Date  . Colonoscopy N/A 07/02/2014    Procedure: COLONOSCOPY;  Surgeon: Scot Junobert T Elliott, MD;  [facility];  Service: Endoscopy;  Laterality: N/A;  . Esophagogastroduodenoscopy N/A 07/02/2014    Procedure: ESOPHAGOGASTRODUODENOSCOPY (EGD);  Surgeon: Scot Junobert T Elliott, MD;  [facility];  Service: Endoscopy;  Laterality: N/A;    There were no vitals filed for this visit.  Visit Diagnosis:  Bilateral low back pain, with sciatica presence unspecified  Cervicalgia  Spasm of muscle, back  Muscle spasms of neck      Subjective Assessment - 01/15/15 1906    Subjective Paitent reports her right side of neck and back is still tight and painful.  She reports she has increased back pain today due to leaning over at sink to wash dishes.  She feels her neck is in spasms due to picking up things that may be too heavy or awkward for her.    Currently in Pain? Yes   Pain Score 8    Pain Location --  back and neck right side   Pain Orientation Right   Pain Descriptors / Indicators --  spams and tightness   Pain Type Chronic pain   Pain Onset More than a month ago        Objective Palpation: + spasm with TrPs  along right side cervical spine and spasms along thoracic and lumbar spine lower region into glute medius muscles both sides AROM: decreased rotation to right cervical spine by 25%, limited lumbar forward flexion 30% with pain as limiting factor (across both sides lower lumbar region Spine mobility: decreased joint mobility thoracic to lumbar spine PA       [facility] Adult PT Treatment/Exercise - 01/15/15 1907    Modalities   Modalities Ultrasound   Ultrasound   Ultrasound parameters    Goals 1MHz continuous @ 1.4w/cm2 applied to right upper trapezius spasms and to both lower lumbar regions, along upper gluteal muscles/spasms Pain;Other (Comment)  improve soft tissue elasticity   Manual Therapy   Manual Therapy Soft tissue mobilization;Joint mobilization   Joint Mobilization thoracic spine and lumbar spine PA mobilzation grade 2-3 glides x 3 reps 10 bouts each segment   Soft tissue mobilization cervical spine to lumbar spine and into both gluteal muscles,  with patient sitting in massage chair     Exercise: re assessed home exercises for scapular rows and shoulder extension to hips, discussed importance of moving and exercising to increase strength and reduce reoccurrence of spasms  Patient response to treatment: reported some improvement with tightness and continues with chief complaint of pain: patient demonstrated improved soft tissue mobility and joint mobility in spine and along areas of spasms with treatment  with less redness and heat as compared to previous session, she verbalized understanding of home program            PT Education - 01/15/15 1945    Education provided Yes   Education Details HEP   Person(s) Educated Patient   Methods Explanation;Demonstration   Comprehension Verbalized understanding             PT Long Term Goals - 01/09/15 1200    PT LONG TERM GOAL #1   Title Patient will be independent with home program for pain control, posture awareness and  progressive exercises without cuing by 02/07/2015 in order to improve function and self manage symptoms for back and neck pain   Baseline Patient has decreased posture awareness/limited knowledge of appropriate pain control strategies and progression of exercises to improve core control/strength and improve function for daily tasks   Status New   PT LONG TERM GOAL #2   Title Patient will demonstrate improved pain level and function wtih daily tasks for personal care, household and work related tasks as demonstrated by Pilgr[employer]'s [employer] score of 25% or better by 02/07/2015   Baseline Modified Oswestry score = 40%   Status New   PT LONG TERM GOAL #3   Title Pateint will demonstrate improved self perceived disabiltiy due to neck symptoms with improved function with daily tasks at work and home by NDI score of 25% or less by 02/07/2015   Baseline NDI = 38%   Status New               Plan - 01/15/15 2000    Clinical Impression Statement Patient demonstrated imrpoved soft tissue elasticity along right side upper trpaezius and along htoracic and lumbar spine bilaterally with STM, [place]. She is more aware of the need to perform exercises in order to control her pain and spasms and will modify intensity and frequency to not exacerbate her symptoms.    Pt will benefit from skilled therapeutic intervention in order to improve on the following deficits Pain;Decreased range of motion;Impaired flexibility;Decreased strength;Increased muscle spasms;Improper body mechanics   Rehab Potential Good   PT Frequency 2x / week   PT Duration 4 weeks   PT Treatment/Interventions Patient/family education;Ultrasound;Dry needling;Moist Heat;Electrical Stimulation;Therapeutic exercise;Cryotherapy;Manual techniques   PT Next Visit Plan pain control with modalities, manual therapy techniques and progressive exercise for core control/strength and flexiblity   PT Home Exercise Plan stabilization in standing with resistive  band for scapular adduciton and shoulder extension, cervical spine deep flexor strengthening        Problem List There are no active problems to display for this patient.   Beacher May PT 01/16/2015, 2:50 PM  [place] [facility] [address] [place], [address] Phone: [phone]   Fax:  [phone]  Name: [facility] MRN: [id number] Date of Birth: 12-May-1960

## 2015-01-19 ENCOUNTER — Ambulatory Visit: Payer: Managed Care, Other (non HMO) | Admitting: Physical Therapy

## 2015-01-19 ENCOUNTER — Encounter: Payer: Self-pay | Admitting: Physical Therapy

## 2015-01-19 DIAGNOSIS — M545 Low back pain: Secondary | ICD-10-CM

## 2015-01-19 DIAGNOSIS — M542 Cervicalgia: Secondary | ICD-10-CM

## 2015-01-19 DIAGNOSIS — M62838 Other muscle spasm: Secondary | ICD-10-CM

## 2015-01-19 DIAGNOSIS — M62830 Muscle spasm of back: Secondary | ICD-10-CM

## 2015-01-20 ENCOUNTER — Other Ambulatory Visit: Payer: Self-pay | Admitting: Physical Medicine and Rehabilitation

## 2015-01-20 DIAGNOSIS — M5412 Radiculopathy, cervical region: Secondary | ICD-10-CM

## 2015-01-20 NOTE — Therapy (Signed)
[place] [facility] [address] [place], [address] Phone: [phone]   Fax:  [phone]  Physical Therapy Treatment  Patient Details  Name: [facility] MRN: [id number] Date of Birth: 14-Jun-1960 Referring Provider: Michael Boston DO  Encounter Date: 01/19/2015      PT End of Session - 01/19/15 1901    Visit Number 4   Number of Visits 8   Date for PT Re-Evaluation 02/07/15   PT Start Time 1807   PT Stop Time 1855   PT Time Calculation (min) 48 min   Activity Tolerance Patient tolerated treatment well   Behavior During Therapy [facility] for tasks assessed/performed      History reviewed. No pertinent past medical history.  Past Surgical History  Procedure Laterality Date  . Colonoscopy N/A 07/02/2014    Procedure: COLONOSCOPY;  Surgeon: Scot Jun, MD;  Location: [facility] ENDOSCOPY;  Service: Endoscopy;  Laterality: N/A;  . Esophagogastroduodenoscopy N/A 07/02/2014    Procedure: ESOPHAGOGASTRODUODENOSCOPY (EGD);  Surgeon: Scot Jun, MD;  [facility] END[facility]COPY;  Service: Endoscopy;  Laterality: N/A;    There were no vitals filed for this visit.  Visit Diagnosis:  Bilateral low back pain, with sciatica presence unspecified  Cervicalgia  Spasm of muscle, back  Muscle spasms of neck      Subjective Assessment - 01/19/15 1823    Subjective Paitent reports her right side of neck and back continues with tight muscles and is painful.  She reports her pain is about the same as it was the other day and the therapy does make a temporary difference at this time. She has pain level 8/10 today in neck and lower back and is going to see Dr. Yves Dill to discuss continued pain and whether she is going to have cervical spine MRI and more lower back injections to assist with pain control. She is not performing any regular exercises at this time due to pain.      Patient Stated Goals able to sit and  stand and do household chores without going into spasms      Objective Palpation: + spasm with TrPs along right side cervical spine and spasms along thoracic and lumbar spine lower region into glute medius muscles both sides AROM: decreased rotation to right cervical spine by 25%,  Spine mobility: decreased joint mobility thoracic to lumbar spine PA          [facility] Adult PT Treatment/Exercise - 01/19/15 2231    Modalities   Modalities Ultrasound   Ultrasound   Ultrasound parameters    Goals continuous @ 1.4w/cm2 applied to right upper trapezius spasms and to both lower lumbar regions, along upper gluteal muscles/spasms Pain;Other (Comment)  improve soft tissue elasticity   Manual Therapy   Manual Therapy Soft tissue mobilization;Joint mobilization   Joint Mobilization thoracic spine and lumbar spine PA mobilzation grade 2-3 glides x 3 reps    Soft tissue mobilization cervical spine/upper trapezius and along right side thoracic to lumbar spine and both hip/gluteal muscles with concentration on glute med., superficial and deep techniques with patient sitting in massage chair       Patient response to treatment: reported some improvement with tightness, continues with chief complaint of pain 5/10: patient demonstrated improved soft tissue mobility and joint mobility in spine and along areas of spasms with treatment with less redness and heat as compared to previous session, she verbalized understanding of home program  PT Education - 01/19/15 1855    Education provided Yes   Education Details Reinforced home exercises for stabilization and strengthening    Person(s) Educated Patient   Methods Explanation   Comprehension Verbalized understanding             PT Long Term Goals - 01/09/15 1200    PT LONG TERM GOAL #1   Title Patient will be independent with home program for pain control, posture awareness and progressive exercises without cuing by  02/07/2015 in order to improve function and self manage symptoms for back and neck pain   Baseline Patient has decreased posture awareness/limited knowledge of appropriate pain control strategies and progression of exercises to improve core control/strength and improve function for daily tasks   Status New   PT LONG TERM GOAL #2   Title Patient will demonstrate improved pain level and function wtih daily tasks for personal care, household and work related tasks as demonstrated by Pilgrim's PrideModified Oswestry score of 25% or better by 02/07/2015   Baseline Modified Oswestry score = 40%   Status New   PT LONG TERM GOAL #3   Title Pateint will demonstrate improved self perceived disabiltiy due to neck symptoms with improved function with daily tasks at work and home by NDI score of 25% or less by 02/07/2015   Baseline NDI = 38%   Status New               Plan - 01/19/15 1955    Clinical Impression Statement Patient improved soft tissue elasticity with decreased spasms along bilateral lower lumbar spine into gluteal muscles with US and STM. She aslo improved soft tissue mobilty along right upper trapezius and thoracic spine right side following STM. She continues with pain as primary complaint in neck and upper back and is going to follow up with Dr. Yves Dillhasnis to determine if she will have an MRI and more injections.    Pt will benefit from skilled therapeutic intervention in order to improve on the following deficits Pain;Decreased range of motion;Impaired flexibility;Decreased strength;Increased muscle spasms;Improper body mechanics   Rehab Potential Good   PT Frequency 2x / week   PT Duration 4 weeks   PT Treatment/Interventions Patient/family education;Ultrasound;Dry needling;Moist Heat;Electrical Stimulation;Therapeutic exercise;Cryotherapy;Manual techniques   PT Next Visit Plan pain control with modalities, manual therapy techniques and progressive exercise for core control/strength and flexiblity    PT Home Exercise Plan stabilization in standing with resistive band for scapular adduciton and shoulder extension, cervical spine deep flexor strengthening        Problem List There are no active problems to display for this patient.   [name], Newton Frutiger PT 01/20/2015, 4:27 PM  [place] [facility] [address] [place], [place], [address] Phone: [phone]   Fax:  [phone]  Name: [facility] MRN: [id number] Date of Birth: [birth year]

## 2015-01-23 ENCOUNTER — Ambulatory Visit: Payer: Managed Care, Other (non HMO) | Admitting: Physical Therapy

## 2015-01-23 ENCOUNTER — Encounter: Payer: Self-pay | Admitting: Physical Therapy

## 2015-01-23 DIAGNOSIS — M62838 Other muscle spasm: Secondary | ICD-10-CM

## 2015-01-23 DIAGNOSIS — M542 Cervicalgia: Secondary | ICD-10-CM

## 2015-01-23 DIAGNOSIS — M545 Low back pain: Secondary | ICD-10-CM | POA: Diagnosis not present

## 2015-01-23 DIAGNOSIS — M62830 Muscle spasm of back: Secondary | ICD-10-CM

## 2015-01-24 NOTE — Therapy (Signed)
[place] [facility] [address] [place], [address] Phone: [phone]   Fax:  [phone]  Physical Therapy Treatment  Patient Details  Name: Sydney Soto MRN: [id number] Date of Birth: January 06, 1961 Referring Provider: Michael Boston DO  Encounter Date: 01/23/2015      PT End of Session - 01/23/15 1200    Visit Number 5   Number of Visits 8   Date for PT Re-Evaluation 02/07/15   PT Start Time 1005   PT Stop Time 1101   PT Time Calculation (min) 56 min   Activity Tolerance Patient tolerated treatment well   Behavior During Therapy [facility] for tasks assessed/performed      History reviewed. No pertinent past medical history.  Past Surgical History  Procedure Laterality Date  . Colonoscopy N/A 07/02/2014    Procedure: COLONOSCOPY;  Surgeon: Scot Jun, MD;  Location: [facility] ENDOSCOPY;  Service: Endoscopy;  Laterality: N/A;  . Esophagogastroduodenoscopy N/A 07/02/2014    Procedure: ESOPHAGOGASTRODUODENOSCOPY (EGD);  Surgeon: Scot Jun, MD;  Location: [facility] ENDOSCOPY;  Service: Endoscopy;  Laterality: N/A;    There were no vitals filed for this visit.  Visit Diagnosis:  Bilateral low back pain, with sciatica presence unspecified  Cervicalgia  Spasm of muscle, back  Muscle spasms of neck      Subjective Assessment - 01/23/15 1011    Subjective Patient reports she had an injection in right lower hip muscle. Patient reports her right side of neck and back are tight and painful.  She reports she has MRI scheduled this coming weekend for neck. She is not performing exercises as regularly as she should.    Limitations Sitting;Standing;Walking;House hold activities   Patient Stated Goals able to sit and stand and do household chores without going into spasms about the same pains that wax and wane depending on her activity level.   Currently in Pain? Yes   Pain Score 5   Pain ranges up to  8/10 with movements and    Pain Location --  right side neck and back   Pain Orientation Right   Pain Descriptors / Indicators Aching;Spasm;Tightness   Pain Type Chronic pain   Pain Onset More than a month ago       Objective Palpation: + spasm with TrPs along right side cervical spine and spasms along thoracic and lumbar spine lower region into glute medius muscles both sides, right > left      [facility] Adult PT Treatment/Exercise - 01/23/15 1427    Modalities   Modalities Ultrasound   Ultrasound   Ultrasound Location cervical spine: upper trapezius muscles   Ultrasound Parameters continuous @ 1.4w/cm2 over spasms/TrPs along upper trapezius muscles and right cervical spine to lumbar regions x 15 min   Ultrasound Goals Pain;Other (Comment)  improve soft tissue elasticity   Manual Therapy   Manual Therapy Soft tissue mobilization;Joint mobilization   Joint Mobilization    Soft tissue mobilization cervical spine to lumbar spine with concentration on cervical and upper trapezius muscles into thoracic spine region paraspinal muscles, superficial and deep techniques with patient sitting in massage chair       Patient response to treatment: reported some improvement with spasms and tight muscle feeling, continues with chief complaint of pain 5/10: patient demonstrated improved soft tissue mobility and joint mobility in spine and along areas of spasms with treatment with improved soft tissue mobility as compared to previous session, she verbalized understanding of the need  to progress exercises as pain begins to subside         PT Education - 01/23/15 1101    Education provided Yes   Education Details HEP current and progression   Person(s) Educated Patient   Methods Explanation   Comprehension Verbalized understanding             PT Long Term Goals - 01/09/15 1200    PT LONG TERM GOAL #1   Title Patient will be independent with home program for pain control, posture  awareness and progressive exercises without cuing by 02/07/2015 in order to improve function and self manage symptoms for back and neck pain   Baseline Patient has decreased posture awareness/limited knowledge of appropriate pain control strategies and progression of exercises to improve core control/strength and improve function for daily tasks   Status New   PT LONG TERM GOAL #2   Title Patient will demonstrate improved pain level and function wtih daily tasks for personal care, household and work related tasks as demonstrated by Pilgrim's PrideModified Oswestry score of 25% or better by 02/07/2015   Baseline Modified Oswestry score = 40%   Status New   PT LONG TERM GOAL #3   Title Pateint will demonstrate improved self perceived disabiltiy due to neck symptoms with improved function with daily tasks at work and home by NDI score of 25% or less by 02/07/2015   Baseline NDI = 38%   Status New               Plan - 01/23/15 1105    Clinical Impression Statement Paitent demonstrated improved soft tissue elasticity and decreasing spasms than previous session. She continues with pain as primary limiting factor and is responding favorably to current treatment for pain control. She is to have an MRI on cervical spine in a few days.    Pt will benefit from skilled therapeutic intervention in order to improve on the following deficits Pain;Decreased range of motion;Impaired flexibility;Decreased strength;Increased muscle spasms;Improper body mechanics   Rehab Potential Good   PT Frequency 2x / week   PT Duration 4 weeks   PT Treatment/Interventions Patient/family education;Ultrasound;Dry needling;Moist Heat;Electrical Stimulation;Therapeutic exercise;Cryotherapy;Manual techniques   PT Next Visit Plan pain control with modalities, manual therapy techniques and progressive exercise for core control/strength and flexiblity        Problem List There are no active problems to display for this  patient.   [name], [name] PT 01/24/2015, 9:19 AM  [facility] [address] [place], [place], [address] Phone: [phone]   Fax:  [phone]  Name: Sydney Soto MRN: [id number] Date of Birth: 1960-06-17

## 2015-01-25 ENCOUNTER — Ambulatory Visit
Admission: RE | Admit: 2015-01-25 | Discharge: 2015-01-25 | Disposition: A | Discharge status: Home / Self Care | Payer: Managed Care, Other (non HMO) | Source: Ambulatory Visit | Attending: Physical Medicine and Rehabilitation | Admitting: Physical Medicine and Rehabilitation

## 2015-01-25 DIAGNOSIS — M5412 Radiculopathy, cervical region: Secondary | ICD-10-CM

## 2015-01-27 ENCOUNTER — Encounter: Payer: Self-pay | Admitting: Physical Therapy

## 2015-01-27 ENCOUNTER — Ambulatory Visit: Payer: Managed Care, Other (non HMO) | Admitting: Physical Therapy

## 2015-01-27 DIAGNOSIS — M545 Low back pain: Secondary | ICD-10-CM | POA: Diagnosis not present

## 2015-01-27 DIAGNOSIS — M542 Cervicalgia: Secondary | ICD-10-CM

## 2015-01-27 NOTE — Therapy (Signed)
[facility] [address], [place], [address] Phone: [phone]   Fax:  [phone]  Physical Therapy Treatment  Patient Details  Name: Sydney Soto MRN: [id number] Date of Birth: 08/16/1960 Referring Provider: Michael Boston DO  Encounter Date: 01/27/2015      PT End of Session - 01/27/15 1830    Visit Number 6   Number of Visits 8   Date for PT Re-Evaluation 02/07/15   PT Start Time 1746   PT Stop Time 1830   PT Time Calculation (min) 44 min   Activity Tolerance Patient tolerated treatment well   Behavior During Therapy [facility] for tasks assessed/performed      History reviewed. No pertinent past medical history.  Past Surgical History  Procedure Laterality Date  . Colonoscopy N/A 07/02/2014    Procedure: COLONOSCOPY;  Surgeon: Scot Jun, MD;  Location: [facility] ENDOSCOPY;  Service: Endoscopy;  Laterality: N/A;  . Esophagogastroduodenoscopy N/A 07/02/2014    Procedure: ESOPHAGOGASTRODUODENOSCOPY (EGD);  Surgeon: Scot Jun, MD;  Location: [facility] ENDOSCOPY;  Service: Endoscopy;  Laterality: N/A;    There were no vitals filed for this visit.  Visit Diagnosis:  Bilateral low back pain, with sciatica presence unspecified  Cervicalgia      Subjective Assessment - 01/27/15 1748    Subjective Patient reports she had an MRI of cervical spine and has the results with noted spurring and increased pain on right side. She continues to report increased lower back pain with prolonged standing and with mild flexion of back such as with wrapping gifts and bending over to wash dishes   Limitations Sitting;Standing;Walking;House hold activities   Diagnostic tests MRI lumbar spine, MRI cervical spine   Patient Stated Goals able to sit and stand and do household chores without going into spasms   Currently in Pain? Yes   Pain Score 5    Pain Location Other (Comment)  right side neck and  bilateral lower back   Pain Orientation Right   Pain Descriptors / Indicators Aching;Spasm;Tightness   Pain Type Chronic pain   Pain Onset More than a month ago   Pain Frequency Intermittent      Objective; Posture; guarded, forward head mild, increased lumbar lordosis in standing, fair posture in sitting Palpation; decreased soft tissue elasticity/mobility cervical spine, right upper trapezius and along thoracic spine to lumbar spine, decreased joint mobility throughout thoracic to lumbar spine with tenderness            [facility] Adult PT Treatment/Exercise - 01/27/15 2213    Modalities   Modalities Ultrasound   Ultrasound   Ultrasound Location right side cervical spine and upper trapezius muscles   Ultrasound Parameters 50% pulsed 1.4 w/cm2   Ultrasound Goals Pain;Other (Comment)  improve soft tissue elasticity   Manual Therapy   Manual Therapy Soft tissue mobilization;Joint mobilization   Joint Mobilization thoracic spine and lumbar spine PA mobilzation grade 2-3 glides x 3 reps with patient seated in massage chair   Soft tissue mobilization cervical spine to lumbar spine with patient sitting in massage chair with superficial techniques to decrease spasms and improve soft tissue elasticity      Exercise; reassessed home program for posture, ROM and scapular adduction in sitting/standing and keeping neutral spine for neck with daily activities to not turn too far right in order to control pain, use step stool to unweight one LE and take strain off back with prolonged standing to avoid flexion  in lower lumbar spine  Patient response to treatment: improved soft tissue elasticity cervical spine into right upper trapezius muscle and along lumbar paraspinal muscles with improved joint mobilty with less tenderness along thoracic to lumbar region, verbalized good understanding of exercises and posture awareness to avoid increased pain in cervical spine and lower back            PT Education - 01/27/15 1830    Education provided Yes   Education Details HEP and control of pain in neck and back   Person(s) Educated Patient   Methods Explanation   Comprehension Verbalized understanding             PT Long Term Goals - 01/09/15 1200    PT LONG TERM GOAL #1   Title Patient will be independent with home program for pain control, posture awareness and progressive exercises without cuing by 02/07/2015 in order to improve function and self manage symptoms for back and neck pain   Baseline Patient has decreased posture awareness/limited knowledge of appropriate pain control strategies and progression of exercises to improve core control/strength and improve function for daily tasks   Status New   PT LONG TERM GOAL #2   Title Patient will demonstrate improved pain level and function wtih daily tasks for personal care, household and work related tasks as demonstrated by Pilgrim's PrideModified Oswestry score of 25% or better by 02/07/2015   Baseline Modified Oswestry score = 40%   Status New   PT LONG TERM GOAL #3   Title Pateint will demonstrate improved self perceived disabiltiy due to neck symptoms with improved function with daily tasks at work and home by NDI score of 25% or less by 02/07/2015   Baseline NDI = 38%   Status New               Plan - 01/27/15 2218    Clinical Impression Statement Patient demonstrates imrpoving soft tissue elasticity and decreasing pain in mid to lower back and continues with limitations of pain, spasms and stiffness in neck and low back. She will continue to require physical therapy intervention to progress exercises in order to strengthen and improve function with decreased symptoms.    Pt will benefit from skilled therapeutic intervention in order to improve on the following deficits Pain;Decreased range of motion;Impaired flexibility;Decreased strength;Increased muscle spasms;Improper body mechanics   Rehab Potential Good   PT Frequency  2x / week   PT Duration 4 weeks   PT Treatment/Interventions Patient/family education;Ultrasound;Dry needling;Moist Heat;Electrical Stimulation;Therapeutic exercise;Cryotherapy;Manual techniques   PT Next Visit Plan pain control with modalities, manual therapy techniques and progressive exercise for core control/strength and flexiblity   PT Home Exercise Plan stabilization in standing with resistive band for scapular adduciton and shoulder extension, cervical spine deep flexor strengthening        Problem List There are no active problems to display for this patient.   Sydney Soto, Sydney Soto PT 01/28/2015, 6:42 PM  [place] [facility] [address] [place], [place], [address] Phone: [phone]   Fax:  [phone]  Name: Sydney Soto MRN: [id number] Date of Birth: [birth year]

## 2015-02-04 ENCOUNTER — Encounter: Payer: Managed Care, Other (non HMO) | Admitting: Physical Therapy

## 2015-02-05 ENCOUNTER — Ambulatory Visit: Payer: Managed Care, Other (non HMO) | Admitting: Physical Therapy

## 2015-02-10 ENCOUNTER — Ambulatory Visit: Payer: Managed Care, Other (non HMO) | Attending: Neurological Surgery | Admitting: Physical Therapy

## 2015-02-10 DIAGNOSIS — M6248 Contracture of muscle, other site: Secondary | ICD-10-CM | POA: Insufficient documentation

## 2015-02-10 DIAGNOSIS — M545 Low back pain: Secondary | ICD-10-CM | POA: Diagnosis present

## 2015-02-10 DIAGNOSIS — M62830 Muscle spasm of back: Secondary | ICD-10-CM | POA: Insufficient documentation

## 2015-02-10 DIAGNOSIS — M542 Cervicalgia: Secondary | ICD-10-CM | POA: Diagnosis not present

## 2015-02-10 DIAGNOSIS — M62838 Other muscle spasm: Secondary | ICD-10-CM

## 2015-02-10 NOTE — Therapy (Signed)
[place] [facility] [address], [place], [address] Phone: [phone]   Fax:  [phone]  Physical Therapy Treatment  Patient Details  Name: Sydney Soto MRN: [id number] Date of Birth: 1961/01/14 Referring Provider: Truitt Merle, MD  Encounter Date: 02/10/2015      PT End of Session - 02/10/15 1830    Visit Number 7   Number of Visits 16   Date for PT Re-Evaluation 03/24/15   PT Start Time 1750   PT Stop Time 1835   PT Time Calculation (min) 45 min   Activity Tolerance Patient tolerated treatment well   Behavior During Therapy [facility] for tasks assessed/performed      No past medical history on file.  Past Surgical History  Procedure Laterality Date  . Colonoscopy N/A 07/02/2014    Procedure: COLONOSCOPY;  Surgeon: Scot Jun, MD;  Location: [facility] ENDOSCOPY;  Service: Endoscopy;  Laterality: N/A;  . Esophagogastroduodenoscopy N/A 07/02/2014    Procedure: ESOPHAGOGASTRODUODENOSCOPY (EGD);  Surgeon: Scot Jun, MD;  Location: [facility] ENDOSCOPY;  Service: Endoscopy;  Laterality: N/A;    There were no vitals filed for this visit.  Visit Diagnosis:  Cervicalgia - Plan: PT plan of care cert/re-cert  Bilateral low back pain, with sciatica presence unspecified - Plan: PT plan of care cert/re-cert  Muscle spasms of neck - Plan: PT plan of care cert/re-cert  Spasm of muscle, back - Plan: PT plan of care cert/re-cert      Subjective Assessment - 02/10/15 1757    Subjective Patient reports her neck on right side is increased today. She is noticing improvement with lower back and hip pain with treatment. She reports that she is dropping items from right hand and she feels weak in her right hand, She also reports feeling funny in her head after sitting or bending head forward and this passes quickly.    Limitations Sitting;Standing;Walking;House hold activities   Patient Stated Goals able to sit  and stand and do household chores without going into spasms   Currently in Pain? Yes   Pain Score 5    Pain Location --  bilateral lower back, right sided neck   Pain Orientation Right   Pain Descriptors / Indicators Aching;Spasm;Tightness   Pain Type Chronic pain   Pain Onset More than a month ago   Pain Frequency Intermittent      Objective:  Posture: + forward head with rounded shoulders, increased lumbar lordosis AROM: cervical spine rotation decreased 25% right and left with pain right side, lateral flexion decreased 25% right and left with pain reported on right side, flexion to 50 degrees with pulling on right, extension 40 degrees with pain in right side of neck Strength: grip strength deferred, to be tested next session, gross strength UE's WFL's with reports of pain in right shoulder with shoulder flexion/abduction testing, rotations WFL's Palpation: + spasms suboccipitally right and left with right>left, upper trapezius with point spasms and levator scapula with taut band palpable, moderate decreased soft tissue mobility throughout spine thoracic to lumbar right>left Outcome measures: NDI 40%, modified Oswestry 40%       [facility] Adult PT Treatment/Exercise - 02/10/15 1758    Modalities   Modalities Ultrasound   Ultrasound   Ultrasound Goals 1 MHz 50% pulsed @ 1.4 w/cm2 applied to right side cervical spine into upper and mid trapezius muslces  Pain;Other (Comment)  improve soft tissue elasticity   Manual Therapy   Manual Therapy Soft tissue  mobilization;Joint mobilization   Joint Mobilization thoracic spine and lumbar spine PA mobilzation grade 2-3 glides x 3 reps    Soft tissue mobilization STM cervical spine paraspinal muscles into upper trapezius muscles to lumbar spine with patient sitting in massage chair      Patient response to treatment: patient with decreased soft tissue spasms and improved elasticity in spine, reports decreased pain "a lot" following US/STM  treatment and able to turn head with less stiffness and difficulty, verbalized understanding for use of cervical roll for sleeping and ROM for neck and shoulders.           PT Education - 02/10/15 1735    Education provided Yes   Education Details HEP for posture awareness, use of cervical roll for sleep and ROM exercises for shoulders, neck   Person(s) Educated Patient   Methods Explanation;Demonstration;Verbal cues   Comprehension Verbalized understanding;Returned demonstration;Verbal cues required             PT Long Term Goals - 02/11/15 1418    PT LONG TERM GOAL #1   Title Patient will be independent with home program for pain control, posture awareness and progressive exercises without cuing by 03/24/2015 in order to improve function and self manage symptoms for back and neck pain   Baseline Patient has decreased posture awareness/limited knowledge of appropriate pain control strategies and progression of exercises to improve core control/strength and improve function for daily tasks   Status Revised   PT LONG TERM GOAL #2   Title Patient will demonstrate improved pain level and function wtih daily tasks for personal care, household and work related tasks as demonstrated by Pilgrim's PrideModified Oswestry score of 25% or better by 01/21/2016   Baseline Modified Oswestry score = 40%   PT LONG TERM GOAL #3   Title Pateint will demonstrate improved self perceived disabiltiy due to neck symptoms with improved function with daily tasks at work and home by NDI score of 25% or less by 03/14/2015   Baseline NDI = 38% (current 40%)   Status Revised               Plan - 02/10/15 1830    Clinical Impression Statement Patient demonstrates improving soft tissue ealsticity and decreased pain in cervical spine with treatments. She continues with chief complaint of intermittent pain in neck and improving lower back/hip pains. She is limited in her ability to perform daily household tasks and has  limited knowledge of good posture awareness, progression of exercises in order to improve core control, strength in upper body and cervical spine and trunk and will therefore benefit from continued physical therpay intervention.    Pt will benefit from skilled therapeutic intervention in order to improve on the following deficits Pain;Decreased range of motion;Impaired flexibility;Decreased strength;Increased muscle spasms;Improper body mechanics   Rehab Potential Good   Clinical Impairments Affecting Rehab Potential (+) age, motivated  (-) chronic pain/condition   PT Treatment/Interventions Patient/family education;Ultrasound;Dry needling;Moist Heat;Electrical Stimulation;Therapeutic exercise;Cryotherapy;Manual techniques   PT Next Visit Plan pain control with modalities, manual therapy techniques and progressive exercise for core control/strength and flexiblity   PT Home Exercise Plan stabilization in standing with resistive band for scapular adduciton and shoulder extension, cervical spine deep flexor strengthening        Problem List There are no active problems to display for this patient.   [name], [name] PT 02/11/2015, 7:59 PM  [place] [facility] [address] [place], [place], [address] Phone:  [phone]   Fax:  [phone]  Name: Sydney Soto MRN: [id number] Date of Birth: 1960/06/29

## 2015-02-12 ENCOUNTER — Ambulatory Visit: Payer: Managed Care, Other (non HMO) | Admitting: Physical Therapy

## 2015-02-12 ENCOUNTER — Ambulatory Visit: Payer: Managed Care, Other (non HMO)

## 2015-02-12 ENCOUNTER — Encounter: Payer: Self-pay | Admitting: Physical Therapy

## 2015-02-12 DIAGNOSIS — M542 Cervicalgia: Secondary | ICD-10-CM | POA: Diagnosis not present

## 2015-02-12 DIAGNOSIS — M62838 Other muscle spasm: Secondary | ICD-10-CM

## 2015-02-12 DIAGNOSIS — M545 Low back pain: Secondary | ICD-10-CM

## 2015-02-12 DIAGNOSIS — M62830 Muscle spasm of back: Secondary | ICD-10-CM

## 2015-02-12 NOTE — Therapy (Signed)
[facility] PH[facility]TS MEDICINE [address] [place], [place], [address] Phone: [phone]   Fax:  [phone]  Physical Therapy Treatment  Patient Details  Name: Sydney KidneyCatherene H Soto MRN: [id number] Date of Birth: 09-Apr-1960 Referring Provider: Michael Bostonhasnis, Benjamin C. DO  Encounter Date: 02/12/2015      PT End of Session - 02/12/15 1945    Visit Number 8   Number of Visits 16   Date for PT Re-Evaluation 03/24/15   PT Start Time 1844   PT Stop Time 1932   PT Time Calculation (min) 48 min   Activity Tolerance Patient tolerated treatment well   Behavior [facility] for tasks assessed/performed      History reviewed. No pertinent past medical history.  Past Surgical History  Procedure Laterality Date  . Colonoscopy N/A 07/02/2014    Procedure: COLONOSCOPY;  Surgeon: Scot Junobert T Elliott, MD;  [facility];  Service: Endoscopy;  Laterality: N/A;  . Esophagogastroduodenoscopy N/A 07/02/2014    Procedure: ESOPHAGOGASTRODUODENOSCOPY (EGD);  Surgeon: Scot Junobert T Elliott, MD;  [facility];  Service: Endoscopy;  Laterality: N/A;    There were no vitals filed for this visit.  Visit Diagnosis:  Bilateral low back pain, with sciatica presence unspecified  Cervicalgia  Muscle spasms of neck  Spasm of muscle, back      Subjective Assessment - 02/12/15 1846    Subjective Patient reports she did not get a good night's sleep and was only able to sleep 3 hours the other night. She had a massge yesterday with hot rocks and massage and slept much better last night.    Limitations Sitting;Standing;Walking;House hold activities   Patient Stated Goals able to sit and stand and do household chores without going into spasms   Pain Location and level Other (Comment) 5/10 neck and back of skull   Pain Orientation Right   Pain Descriptors / Indicators Aching;Pressure  when she cough it hurts   Pain Type Chronic pain   Pain  Onset More than a month ago   Pain Frequency Intermittent      Objective: Palpation: + spasms with increased tenderness along right side suboccipital muscles, upper trapezius and lower back right side lumbar spine Gait: decreased trunk rotation AROM: decreased rotation cervical spine to right 50%, left 30%       [facility] Adult PT Treatment/Exercise - 02/12/15 1849    Modalities   Modalities Ultrasound   Ultrasound   Ultrasound  Parameters and Goals 1MHz pulsed 50% @ 1.4 w/cm2 to right side cervical spine/upper trapezius and along region of soft tissue tenderness right lumbar spine x 10 min. Pain;Other (Comment)  improve soft tissue elasticity   Manual Therapy   Manual Therapy Soft tissue mobilization;Joint mobilization   Joint Mobilization thoracic spine and lumbar spine PA mobilzation grade 2-3 glides x 3 reps    Soft tissue mobilization STM with superficial techniques performed by PT for improving soft tissue elasticity and reduction or pain/spasms in cervical spine to lumbar spine with patient sitting in massage chair     Exercise: patient performed exercises with guidance, verbal and tactile cues and demonstration of PT: seated scapular rows with 5# x 15-20 reps, chest press 5# with assistance of PT x 10 reps, straight arm pull downs with 10# x 15 reps, hip rotary machine 55# for hip abduction x 10 reps, 70# for hip extension and adduction x 15 reps, shoulder extension with red resistive band x 10 reps, diagonal wedding march no  weight x 2 min. With repetition and verbal cues and demonstration to perform with correct stride and technique   Patient response to treatment; patient with decreased spasms by 50% or > following [place] and soft tissue mobilization, pain reported in right shoulder and hip with all exercises, mild to moderate and able to complete exercises with guidance and cuing, no worse at end of exercise, patient verbalized understanding of exercises for home/at local fitness  facility when ready         PT Education - 02/12/15 1920    Education provided Yes   Education Details Exercise instruction for stabilization/upper body/lower body   Person(s) Educated Patient   Methods Explanation;Demonstration;Verbal cues   Comprehension Verbalized understanding;Returned demonstration;Verbal cues required             PT Long Term Goals - 02/11/15 1418    PT LONG TERM GOAL #1   Title Patient will be independent with home program for pain control, posture awareness and progressive exercises without cuing by 03/24/2015 in order to improve function and self manage symptoms for back and neck pain   Baseline Patient has decreased posture awareness/limited knowledge of appropriate pain control strategies and progression of exercises to improve core control/strength and improve function for daily tasks   Status Revised   PT LONG TERM GOAL #2   Title Patient will demonstrate improved pain level and function wtih daily tasks for personal care, household and work related tasks as demonstrated by Pilgr[employer]'s [employer] score of 25% or better by 01/21/2016   Baseline Modified Oswestry score = 40%   PT LONG TERM GOAL #3   Title Pateint will demonstrate improved self perceived disabiltiy due to neck symptoms with improved function with daily tasks at work and home by NDI score of 25% or less by 03/14/2015   Baseline NDI = 38% (current 40%)   Status Revised               Plan - 02/12/15 1945    Clinical Impression Statement Progressing with exercises with complaints of increased soreness in right hip and cervical spine. Able to complete exercises with verbal cues and demonstration. Overall demonstrated decreased spasms, improved soft tissue elasticity and decreased pain at end of session.    Pt will benefit from skilled therapeutic intervention in order to improve on the following deficits Pain;Decreased range of motion;Impaired flexibility;Decreased strength;Increased muscle  spasms;Improper body mechanics   Rehab Potential Good   PT Frequency 2x / week   PT Duration 4 weeks   PT Treatment/Interventions Patient/family education;Ultrasound;Dry needling;Moist Heat;Electrical Stimulation;Therapeutic exercise;Cryotherapy;Manual techniques   PT Next Visit Plan pain control with modalities, manual therapy techniques and progressive exercise for core control/strength and flexiblity        Problem List There are no active problems to display for this patient.   Beacher May PT 02/13/2015, 1:11 PM  [place] [facility] PHYSICAL AND SPORTS MEDICINE [address], [place], [address] Phone: [phone]   Fax:  [phone]  Name: CARRISA KELLER MRN: [id number] Date of Birth: 05-21-1960

## 2015-02-17 ENCOUNTER — Encounter: Payer: Self-pay | Admitting: Physical Therapy

## 2015-02-17 ENCOUNTER — Ambulatory Visit: Payer: Managed Care, Other (non HMO) | Admitting: Physical Therapy

## 2015-02-17 DIAGNOSIS — M62838 Other muscle spasm: Secondary | ICD-10-CM

## 2015-02-17 DIAGNOSIS — M62830 Muscle spasm of back: Secondary | ICD-10-CM

## 2015-02-17 DIAGNOSIS — M542 Cervicalgia: Secondary | ICD-10-CM | POA: Diagnosis not present

## 2015-02-17 NOTE — Therapy (Signed)
[place] [facility] PH[facility]ORTS MEDICINE [address], [place], [address] Phone: [phone]   Fax:  [phone]  Physical Therapy Treatment  Patient Details  Name: KAMEELA LEIPOLD MRN: [id number] Date of Birth: 06/24/1960 Referring Provider: Ditty, Loura Halt, MD  Encounter Date: 02/17/2015      PT End of Session - 02/17/15 1730    Visit Number 9   Number of Visits 16   Date for PT Re-Evaluation 03/24/15   PT Start Time 1628   PT Stop Time 1725   PT Time Calculation (min) 57 min      History reviewed. No pertinent past medical history.  Past Surgical History  Procedure Laterality Date  . Colonoscopy N/A 07/02/2014    Procedure: COLONOSCOPY;  Surgeon: Scot Jun, MD;  Location: [facility] ENDOSCOPY;  Service: Endoscopy;  Laterality: N/A;  . Esophagogastroduodenoscopy N/A 07/02/2014    Procedure: ESOPHAGOGASTRODUODENOSCOPY (EGD);  Surgeon: Scot Jun, MD;  Location: [facility] ENDOSCOPY;  Service: Endoscopy;  Laterality: N/A;    There were no vitals filed for this visit.  Visit Diagnosis:  Cervicalgia  Muscle spasms of neck  Spasm of muscle, back      Subjective Assessment - 02/17/15 1630    Subjective Patient reports she is able to sleep better with using cervical roll for sleeping. She has increased pain with lifting and pulling activties.    Limitations Sitting;Standing;Walking;House hold activities   Patient Stated Goals able to sit and stand and do household chores without going into spasms   Currently in Pain? Yes   Pain Score 5    Pain Location --  neck and back of skull, lower back in spasms but not as bad   Pain Descriptors / Indicators Aching;Pressure   Pain Type Chronic pain   Pain Onset More than a month ago   Pain Frequency Intermittent         Objective: Palpation: + spasms with increased tenderness along right side suboccipital muscles, upper trapezius and mid back right side of  spine         [facility] Adult PT Treatment/Exercise - 02/17/15 1635    Modalities   Modalities Ultrasound   Ultrasound   Ultrasound parameters and  Goals pulsed 50% @ 1.4 w/cm2 to right side cervical spine/upper trapezius and along region of soft tissue tenderness right upper thoracic spine x 10 min Pain;Other (Comment)  improve soft tissue elasticity   Manual Therapy   Manual Therapy Soft tissue mobilization;Joint mobilization   Joint Mobilization thoracic spine and lumbar spine PA mobilzation grade 2-3 glides x 3 reps    Soft tissue mobilization STM with superficial techniques performed by PT for improving soft tissue elasticity and reduction or pain/spasms in cervical spine to lumbar spine with patient sitting in massage chair      Exercise: patient performed exercises with guidance, verbal and tactile cues and demonstration of PT: seated scapular rows with 10# x 15-20 reps, chest press 10# with assistance of PT x 10 reps, straight arm pull downs with 15# x 15 reps, seated reverse chin ups with 15# x 15 reps following demonstration and with verbal cuing, hip rotary machine 55# for hip abduction x 10 reps, 70# for hip extension 2 x 15 reps, diagonal wedding march no weight x 2 min. With repetition and verbal cues and demonstration to perform with correct stride and technique   Patient response to treatment; patient with decreased spasms by 50% or > following [place] and soft  tissue mobilization, pain reported in right shoulder and knee with decreased pain in right hip with all exercises, mild to moderate and able to complete exercises with guidance and cuing, no worse at end of exercise, patient verbalized understanding of exercises for home           PT Education - 02/17/15 1645    Education provided Yes   Education Details proper positioning during exercises performed in clinic for stabilization/strengthening   Person(s) Educated Patient   Methods Explanation;Demonstration;Verbal  cues   Comprehension Verbalized understanding;Returned demonstration;Verbal cues required             PT Long Term Goals - 02/11/15 1418    PT LONG TERM GOAL #1   Title Patient will be independent with home program for pain control, posture awareness and progressive exercises without cuing by 03/24/2015 in order to improve function and self manage symptoms for back and neck pain   Baseline Patient has decreased posture awareness/limited knowledge of appropriate pain control strategies and progression of exercises to improve core control/strength and improve function for daily tasks   Status Revised   PT LONG TERM GOAL #2   Title Patient will demonstrate improved pain level and function wtih daily tasks for personal care, household and work related tasks as demonstrated by Pilgrim's PrideModified Oswestry score of 25% or better by 01/21/2016   Baseline Modified Oswestry score = 40%   PT LONG TERM GOAL #3   Title Pateint will demonstrate improved self perceived disabiltiy due to neck symptoms with improved function with daily tasks at work and home by NDI score of 25% or less by 03/14/2015   Baseline NDI = 38% (current 40%)   Status Revised               Plan - 02/17/15 1730    Clinical Impression Statement Patient demonstrated some muscle soreness with exercises. She improved posture and technique during session with demonstration and verbal cuing. She continues with spasms and pain in cervical spine and along right side of back and will benefit from additional physical therapy intervention in order to improve pain and advance exercise for self management.    Pt will benefit from skilled therapeutic intervention in order to improve on the following deficits Pain;Decreased range of motion;Impaired flexibility;Decreased strength;Increased muscle spasms;Improper body mechanics   Rehab Potential Good   PT Frequency 2x / week   PT Duration 4 weeks   PT Treatment/Interventions Patient/family  education;Ultrasound;Dry needling;Moist Heat;Electrical Stimulation;Therapeutic exercise;Cryotherapy;Manual techniques   PT Next Visit Plan pain control with modalities, manual therapy techniques and progressive exercise for core control/strength and flexiblity        Problem List There are no active problems to display for this patient.   [name], [name] PT 02/17/2015, 10:27 PM  [facility] [address] [place], [place], [address] Phone: [phone]   Fax:  [phone]  Name: Sharla KidneyCatherene H Sensing MRN: [id number] Date of Birth: 13-Aug-1960

## 2015-02-18 ENCOUNTER — Ambulatory Visit: Payer: Managed Care, Other (non HMO) | Admitting: Physical Therapy

## 2015-02-25 ENCOUNTER — Encounter: Payer: Managed Care, Other (non HMO) | Admitting: Physical Therapy

## 2015-02-26 ENCOUNTER — Ambulatory Visit: Payer: Managed Care, Other (non HMO) | Admitting: Physical Therapy

## 2015-03-03 ENCOUNTER — Ambulatory Visit: Payer: Managed Care, Other (non HMO) | Admitting: Physical Therapy

## 2015-03-04 ENCOUNTER — Encounter: Payer: Self-pay | Admitting: Physical Therapy

## 2015-03-04 ENCOUNTER — Ambulatory Visit: Payer: Managed Care, Other (non HMO) | Admitting: Physical Therapy

## 2015-03-04 DIAGNOSIS — M62838 Other muscle spasm: Secondary | ICD-10-CM

## 2015-03-04 DIAGNOSIS — M62830 Muscle spasm of back: Secondary | ICD-10-CM

## 2015-03-04 DIAGNOSIS — M542 Cervicalgia: Secondary | ICD-10-CM | POA: Diagnosis not present

## 2015-03-04 DIAGNOSIS — M545 Low back pain: Secondary | ICD-10-CM

## 2015-03-04 NOTE — Therapy (Signed)
[place] [facility] PHYSICAL [facility] SPORTS MEDICINE [address], [place], [address] Phone: [phone]   Fax:  [phone]  Physical Therapy Treatment  Patient Details  Name: Sydney Soto MRN: [id number] Date of Birth: [birth year] Referring Provider: Ditty, Loura Halt, MD  Encounter Date: 03/04/2015      PT End of Session - 03/04/15 1948    Visit Number 10   Number of Visits 16   Date for PT Re-Evaluation 03/24/15   PT Start Time 1903   PT Stop Time 1947   PT Time Calculation (min) 44 min   Activity Tolerance Patient tolerated treatment well   Behavior During Therapy [facility] for tasks assessed/performed      History reviewed. No pertinent past medical history.  Past Surgical History  Procedure Laterality Date  . Colonoscopy N/A 07/02/2014    Procedure: COLONOSCOPY;  Surgeon: Scot Jun, MD;  Location: [facility] ENDOSCOPY;  Service: Endoscopy;  Laterality: N/A;  . Esophagogastroduodenoscopy N/A 07/02/2014    Procedure: ESOPHAGOGASTRODUODENOSCOPY (EGD);  Surgeon: Scot Jun, MD;  Location: [facility] ENDOSCOPY;  Service: Endoscopy;  Laterality: N/A;    There were no vitals filed for this visit.  Visit Diagnosis:  Muscle spasms of neck  Cervicalgia  Spasm of muscle, back  Bilateral low back pain, with sciatica presence unspecified      Subjective Assessment - 03/04/15 1904    Subjective Patient reports she went to rehabilitation center and was put on another medication. She has pain in neck and lower back that gets worse at times with movement and sitting still.   Limitations Sitting;Standing;Walking;House hold activities   Currently in Pain? Yes   Pain Score 5    Pain Location Other (Comment)  lower back beginning to come back with pain and on left side as well, neci still tight and sore/painful   Pain Orientation Right;Left   Pain Descriptors / Indicators Aching   Pain Type Chronic pain   Pain Onset More than a month  ago        Objective: Palpation: + spasms with increased tenderness along right side suboccipital muscles, upper trapezius and mid back right side of spine, and lumbar paraspinal muscle sbilaterally Posture: increased lumbar lordosis, mild to moderate forward head posture       [facility] Adult PT Treatment/Exercise - 03/04/15 1910                  Manual Therapy   Manual Therapy Soft tissue mobilization;Joint mobilization   Joint Mobilization    Soft tissue mobilization cervical spine to lumbar spine with patient sitting in massage chair PT performed STM superficial techniques and MFR techniques to improve soft tissu elasticity and reduce pain        Exercises:  patient performed exercises with guidance, verbal and tactile cues and demonstration of PT: seated scapular rows with 10# x 15-20 reps, chest press 10# with assistance of PT x 10 reps, straight arm pull downs with 15# x 15 reps, seated reverse chin ups with 15# x 15 reps following demonstration and with verbal cuing, hip rotary machine 55# for hip abduction x 10 reps, 70# for hip extension 2 x 15 reps,    Patient response to treatment: Patient required assistance to perform exercises with correct positioning technique, decreased tenderness and improved soft tissue elasticity At least 25% along cervical spine, right upper trapezius and along back paraspinals, pain level reported as same        PT Long Term Goals -  02/11/15 1418    PT LONG TERM GOAL #1   Title Patient will be independent with home program for pain control, posture awareness and progressive exercises without cuing by 03/24/2015 in order to improve function and self manage symptoms for back and neck pain   Baseline Patient has decreased posture awareness/limited knowledge of appropriate pain control strategies and progression of exercises to improve core control/strength and improve function for daily tasks   Status Revised   PT LONG TERM GOAL #2   Title Patient  will demonstrate improved pain level and function wtih daily tasks for personal care, household and work related tasks as demonstrated by Modified Oswestry score of 25% or better by 01/21/2016   Baseline Modified Oswestry score = 40%   PT LONG TERM GOAL #3   Title Pateint will demonstrate improved self perceived disabiltiy due to neck symptoms with improved function with daily tasks at work and home by NDI score of 25% or less by 03/14/2015   Baseline NDI = 38% (current 40%)   Status Revised               Plan - 03/04/15 1950    Clinical Impression Statement Patient demonstrated decreased spasms and was able to complete exercises with minimal cuing. She continues with weakness, spasms and pain in her neck and back and will benefit from additional physical therapy intervention to improve afunction with less pain, difficulty.    Pt will benefit from skilled therapeutic intervention in order to improve on the following deficits Pain;Decreased range of motion;Impaired flexibility;Decreased strength;Increased muscle spasms;Improper body mechanics   Rehab Potential Good   PT Frequency 2x / week   PT Duration 4 weeks   PT Treatment/Interventions Patient/family education;Ultrasound;Dry needling;Moist Heat;Electrical Stimulation;Therapeutic exercise;Cryotherapy;Manual techniques   PT Next Visit Plan pain control with modalities, manual therapy techniques and progressive exercise for core control/strength and flexiblity        Problem List There are no active problems to display for this patient.   Beacher May PT 03/05/2015, 2:00 PM  [place] [facility] PHYSICAL AND SPORTS MEDICINE [address] [place], [address] Phone: [phone]   Fax:  [phone]  Name: Sydney Soto MRN: [id number] Date of Birth: Nov 10, 1960

## 2015-03-05 ENCOUNTER — Encounter: Payer: Managed Care, Other (non HMO) | Admitting: Physical Therapy

## 2015-03-11 ENCOUNTER — Encounter: Payer: Self-pay | Admitting: Physical Therapy

## 2015-03-11 ENCOUNTER — Ambulatory Visit: Payer: Managed Care, Other (non HMO) | Attending: Neurological Surgery | Admitting: Physical Therapy

## 2015-03-11 DIAGNOSIS — M62830 Muscle spasm of back: Secondary | ICD-10-CM | POA: Insufficient documentation

## 2015-03-11 DIAGNOSIS — M62838 Other muscle spasm: Secondary | ICD-10-CM

## 2015-03-11 DIAGNOSIS — M6248 Contracture of muscle, other site: Secondary | ICD-10-CM | POA: Diagnosis not present

## 2015-03-11 DIAGNOSIS — M542 Cervicalgia: Secondary | ICD-10-CM | POA: Diagnosis present

## 2015-03-12 NOTE — Therapy (Signed)
[place] [facility] [address], [place], [address] Phone: [phone]   Fax:  [phone]  Physical Therapy Treatment  Patient Details  Name: Sydney Soto MRN: [id number] Date of Birth: 09-24-1960 Referring Provider: Ditty, Loura Halt, MD  Encounter Date: 03/11/2015      PT End of Session - 03/11/15 1740    Visit Number 11   Number of Visits 16   PT Start Time 1653   PT Stop Time 1740   PT Time Calculation (min) 47 min   Activity Tolerance Patient tolerated treatment well   Behavior During Therapy [facility] for tasks assessed/performed      History reviewed. No pertinent past medical history.  Past Surgical History  Procedure Laterality Date  . Colonoscopy N/A 07/02/2014    Procedure: COLONOSCOPY;  Surgeon: Scot Jun, MD;  Location: [facility] ENDOSCOPY;  Service: Endoscopy;  Laterality: N/A;  . Esophagogastroduodenoscopy N/A 07/02/2014    Procedure: ESOPHAGOGASTRODUODENOSCOPY (EGD);  Surgeon: Scot Jun, MD;  Location: [facility] ENDOSCOPY;  Service: Endoscopy;  Laterality: N/A;    There were no vitals filed for this visit.  Visit Diagnosis:  Muscle spasms of neck  Cervicalgia  Spasm of muscle, back      Subjective Assessment - 03/11/15 1655    Subjective Patient reports she is considering discontinuing therapy at this time and continuing independently and will monitor self x 2 weeks and return for follow up assessment. She feels the [facility] and [place] have made a great difference and would also like to review exercises for gym today.    Patient Stated Goals able to sit and stand and do household chores without going into spasms   Currently in Pain? Yes   Pain Score 5    Pain Location Other (Comment)  right side lower back and neck right side   Pain Orientation Right   Pain Descriptors / Indicators Aching;Spasm   Pain Type Chronic pain   Pain Onset More than a month ago   Pain Frequency  Intermittent   Aggravating Factors  lifting, standing for extended periods of time       Objective: Posture: increased lumbar lordosis, moderate forward head posture with rounded shoulders Palpation: + spasm along right upper trapezius, medial border of right scapula and lower lumbar spine paraspinals AROM: cervical spine rotation decreased 25% right and left with pain right side, lateral flexion decreased 25% right and left with pain reported on right side, flexion to 50 degrees with pulling on right, extension 40 degrees with pain in right side of neck  (Initial: Cervical spine extension 40, flexion 65, lateral flexion left 35, right 50 with reported increased pull on right with lateral flexion to left)            [facility] Adult PT Treatment/Exercise - 03/11/15 1700    Modalities   Modalities Ultrasound   Ultrasound   Ultrasound Location, parameters and Goals Cervical spine/trapezius into medial border of scapula and lower lumbar paraspinal muscles right side with patient seated in massage chair; US performed by PT 50% pulsed @ 1.4 w/cm2  Pain;Other (Comment)  improve soft tissue elasticity   Manual Therapy   Manual Therapy Soft tissue mobilization        Soft tissue mobilization STM performed by PT with superficial and deep releases/techniques to improve soft tissue elasticity along cervical spine to lumbar spine with patient sitting in massage chair with concentration along right thoracic spine and right lumbar paraspinal muscles  Exercise: instructed in proper posture, use of gym equipment for scapular rows, straight arm pull downs, chest press and reverse chin up: demonstrated with verbal cues and given handout for reference, patient verbalized understanding of exercises following demonstration/instruction  Patient response to treatment: improved soft tissue elasticity with decreased pain and tenderness in right side thoracic, lumbar spine and along upper trapezius, She  verbalized understanding of home program following demonstration and verbal re assessment           PT Education - 03/11/15 1740    Education provided Yes   Education Details Home program using gym machines for scapular rows, chest press, straight arm pull downs and reverse chin ups, demonstrated    Person(s) Educated Patient   Methods Explanation;Demonstration;Handout   Comprehension Verbalized understanding             PT Long Term Goals - 02/11/15 1418    PT LONG TERM GOAL #1   Title Patient will be independent with home program for pain control, posture awareness and progressive exercises without cuing by 03/24/2015 in order to improve function and self manage symptoms for back and neck pain   Baseline Patient has decreased posture awareness/limited knowledge of appropriate pain control strategies and progression of exercises to improve core control/strength and improve function for daily tasks   Status Revised   PT LONG TERM GOAL #2   Title Patient will demonstrate improved pain level and function wtih daily tasks for personal care, household and work related tasks as demonstrated by [employer] score of 25% or better by 01/21/2016   Baseline Modified Oswestry score = 40%   PT LONG TERM GOAL #3   Title Pateint will demonstrate improved self perceived disabiltiy due to neck symptoms with improved function with daily tasks at work and home by NDI score of 25% or less by 03/14/2015   Baseline NDI = 38% (current 40%)   Status Revised               Plan - 03/11/15 1743    Clinical Impression Statement Patient demonstrates good understanding of home  exercises and is ready to decrease therapy visits to every other week and begin to transition to self management of symptoms and exercises. She continues with intermittent pain and spasms in back and neck with certain activities/movement. Plan; follow for re assessment in 2 weeks and determine further needs.    Pt will  benefit from skilled therapeutic intervention in order to improve on the following deficits Pain;Decreased range of motion;Impaired flexibility;Decreased strength;Increased muscle spasms;Improper body mechanics   Rehab Potential Good   PT Frequency 2x / week   PT Duration 4 weeks   PT Treatment/Interventions Patient/family education;Ultrasound;Dry needling;Moist Heat;Electrical Stimulation;Therapeutic exercise;Cryotherapy;Manual techniques   PT Next Visit Plan pain control with modalities, manual therapy techniques and progressive exercise for core control/strength and flexiblity   PT Home Exercise Plan continue with strengthening and stabilization as instructed with addition of gym exercises        Problem List There are no active problems to display for this patient.   Beacher May PT 03/12/2015, 4:03 PM   [facility] [address], [place], [address] Phone: [phone]   Fax:  [phone]  Name: SUHAYLA CHISOM MRN: [id number] Date of Birth: [birth year]

## 2015-03-25 ENCOUNTER — Ambulatory Visit: Payer: Managed Care, Other (non HMO) | Admitting: Physical Therapy

## 2015-05-18 ENCOUNTER — Other Ambulatory Visit: Payer: Self-pay | Admitting: Unknown Physician Specialty

## 2015-05-18 DIAGNOSIS — Z1231 Encounter for screening mammogram for malignant neoplasm of breast: Secondary | ICD-10-CM

## 2015-05-29 ENCOUNTER — Ambulatory Visit
Admission: RE | Admit: 2015-05-29 | Discharge: 2015-05-29 | Disposition: A | Discharge status: Home / Self Care | Payer: Managed Care, Other (non HMO) | Source: Ambulatory Visit | Attending: Unknown Physician Specialty | Admitting: Unknown Physician Specialty

## 2015-05-29 DIAGNOSIS — Z1231 Encounter for screening mammogram for malignant neoplasm of breast: Secondary | ICD-10-CM

## 2015-06-18 ENCOUNTER — Ambulatory Visit: Payer: Managed Care, Other (non HMO) | Attending: Neurological Surgery | Admitting: Physical Therapy

## 2015-06-18 ENCOUNTER — Encounter: Payer: Self-pay | Admitting: Physical Therapy

## 2015-06-18 DIAGNOSIS — M25621 Stiffness of right elbow, not elsewhere classified: Secondary | ICD-10-CM | POA: Insufficient documentation

## 2015-06-18 DIAGNOSIS — M6281 Muscle weakness (generalized): Secondary | ICD-10-CM | POA: Insufficient documentation

## 2015-06-18 DIAGNOSIS — M25521 Pain in right elbow: Secondary | ICD-10-CM | POA: Diagnosis present

## 2015-06-18 DIAGNOSIS — M25522 Pain in left elbow: Secondary | ICD-10-CM | POA: Insufficient documentation

## 2015-06-18 NOTE — Therapy (Addendum)
[facility] [address] [place], [place], [address] Phone: [phone]   Fax:  [phone]  Physical Therapy Evaluation  Patient Details  Name: Sydney KidneyCatherene H Mossbarger MRN: [id number] Date of Birth: November 20, 1960 Referring Provider: Dante GangJonathan T Mundy, PA  Encounter Date: 06/18/2015      PT End of Session - 06/18/15 1953    Visit Number 1   Number of Visits 12   Date for PT Re-Evaluation 07/30/15   PT Start Time 1733   PT Stop Time 1823   PT Time Calculation (min) 50 min   Activity Tolerance Patient tolerated treatment well   Behavior During Therapy [facility] for tasks assessed/performed      History reviewed. No pertinent past medical history.  Past Surgical History  Procedure Laterality Date  . Colonoscopy N/A 07/02/2014    Procedure: COLONOSCOPY;  Surgeon: Scot Junobert T Elliott, MD;  [facility];  Service: Endoscopy;  Laterality: N/A;  . Esophagogastroduodenoscopy N/A 07/02/2014    Procedure: ESOPHAGOGASTRODUODENOSCOPY (EGD);  Surgeon: Scot Junobert T Elliott, MD;  [facility];  Service: Endoscopy;  Laterality: N/A;    There were no vitals filed for this visit.       Subjective Assessment - 06/18/15 1743    Subjective Patient reports acute on chronic right elbow pain along the lateral epicondyle starting 02/08/15. Patient states increased pain and weakness with general gripping and elbow extension mentioning particular difficulty with writing, lifting, carrying, furniture restoration, cleaning, and vacuuming. Patient mentions the pain after performing the motion lasts for 20-7630min. Patient reports no alleviating factors.    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    Patient Stated Goals To be able to lift carry and strtch arm.    Currently in Pain? Yes   Pain Score 8    Pain Location Elbow   Pain Orientation Right   Pain Descriptors / Indicators Aching;Burning   Pain Type Chronic pain;Acute pain  Acute on Chronic   Pain Onset More than a month ago   Pain Frequency Constant            [facility] PT Assessment - 06/18/15 1741    Assessment   Medical Diagnosis Lateral Epicondylitis of right elbow   Referring Provider Dante GangJonathan T Mundy, PA   Onset Date/Surgical Date 02/08/15   Hand Dominance Right   Next MD Visit unknown   Prior Therapy none   Precautions   Precautions None   Balance Screen   Has the patient fallen in the past 6 months No   Has the patient had a decrease in activity level because of a fear of falling?  No   Is the patient reluctant to leave their home because of a fear of falling?  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  Palpation: Increased tenderness and pain over the lateral epicondyle on the right elbow. Tender to  palpation over the common wrist extensors and throughout the muscle bellies throughout.   Measurement:  R Wrist AROM/MMT: Flexion: 70 deg,  4-/5 -- limited by pain; Extension: 4-/5 -- limited by pain, 50 deg Elbow AROM/MMT: Flexion:WNL, 5/5, Extension: WNL-- increase in pain at end range, 5/5, Supination: 50, 4-/5-- limited by pain, Pronation: 90 deg, 5/5   Special Testing: Percussion test against lateral elbow epicondyle -- (+) for reproduction of pain Grip strength with  dynamometer: R 10#, 20#; L: 35#, 30#  Outcome Measures:  Quick DASH: 61% impaired (Moderate-severe self perceived dysfunction); work module: 31% impaired (Moderate self perceived dysfunction)  Therapeutic Exercise: Patient performed exercises with guidance, verbal and tactile cues and demonstration of therapist: Wrist AROM in neutral position (not against gravity) -- x10 Wrist flexion with overpressure -- 10 sec hold x2 Elbow extension in sitting -- 5 sec hold x 10  Manual Therapy:  STM ultilizing superficial techniques performed over the wrist extensor tendons and muscle belly to decrease pain and spasms with the patient positioned in sitting.    Modalities: Ultrasound performed to right UE elbow over regions of  lateral epicondyle and common wrist extensor tendons to decrease inflammation and decrease spasms with patient positioned in sitting. Performed 4 min of continuous [place], 1.4 W/cm2, 3.3 MHz and 4 min of 50% duty cycle with 3.3 MHz at 1.4 w/cm2. Skin examined before and after treatment with no adverse signs noted.   Patient response to treatment: Decreased pain and spasms noted following Ultrasound indicating decreased inflammation/spasms. Good demonstration of wrist AROM flexion/extension requiring cueing to perform in neutral position (not going against gravity) as to not increase elbow pain.         PT Education - 06/18/15 1951    Education provided Yes   Education Details HEP: elbow extension, wirst flexion/extension AROM (neutral not against gravity), wrist flexion with overpressure, self mobilization   Person(s) Educated Patient   Methods Explanation;Demonstration;handout   Comprehension Verbalized understanding;Returned demonstration             PT Long Term Goals - 06/18/15 2015    PT LONG TERM GOAL #1   Title Patient will score a 40% or less on the QuickDASH by 07/30/15 demonstrating significant improvement with elbow function and ability to better perform  lifitng activities.   Baseline QuickDASH score: 61% impaired   Status New   PT LONG TERM GOAL #2   Title Patient will be independent with exercise performance and progression focussed on improving muscular endurance, coordination, and strength by 07/30/15 to continue/maintain functional improvements after discharge from therapy.   Baseline Dependent with exercise performance and progression.    Status New   PT LONG TERM GOAL #3   Title Patient will demonstrate hand grip strength over 30# on the right UE by 07/30/15 to demostrate functional elbow improvement and improved ability to perform carrying activities.    Baseline R LE hand grip strength: 10#, 20#   Status New               Plan - 06/18/15 1953    Clinical Impression Statement Patient is a 55 yo right handed female presenting with right elbow pain and stiffness over the lateral epicondyle on the right UE. Patient demonstrates impaired hand grip strength, QuickDASH score(61% impaired), and increased pain with gripping and wrist extension indicating elbow dysfunction affecting the common wrist extensors.Patient further presents with decreased muscular coordination/strength/endurance with wrist and elbow movements indicating decreased functional capacity and will benefit from skilled therapy  to return to prior level of function.    Rehab Potential Good   Clinical Impairments Affecting Rehab Potential (+): age, highly motivated; (-): chronicity of pain   PT Frequency 2x / week   PT Duration 6 weeks   PT Treatment/Interventions Patient/family education;Ultrasound;Dry needling;Moist Heat;Electrical Stimulation;Therapeutic exercise;Cryotherapy;Manual techniques;Iontophoresis /ml Dexamethasone;Therapeutic activities;Neuromuscular re-education   PT Next Visit Plan Pain control with modalities, wrist coordination, endurance exercises.   PT Home Exercise Plan Wrist flexion with overpressure, neutral wirst AROM, self massage to wrist  extensors.   Consulted and Agree with Plan of Care Patient      Patient will benefit from skilled therapeutic intervention in order to improve the following deficits and impairments:  Decreased mobility, Decreased strength, Increased edema, Pain, Increased muscle spasms, Impaired UE functional use, Decreased endurance, Increased fascial restricitons, Decreased range of motion  Visit Diagnosis: Pain in right elbow  Stiffness of right elbow, not elsewhere classified     Problem List There are no active problems to display for this patient.   Myrene Galas, SPT 06/19/2015, 7:51 AM  [place] SPORTS MEDIC[facility] [address] [place], [address] Phone: [phone]   Fax:  [phone]  Name: YARETH KEARSE MRN: [id number] Date of Birth: 1960/12/22

## 2015-06-23 ENCOUNTER — Ambulatory Visit: Payer: Managed Care, Other (non HMO) | Admitting: Physical Therapy

## 2015-06-23 ENCOUNTER — Encounter: Payer: Self-pay | Admitting: Physical Therapy

## 2015-06-23 DIAGNOSIS — M25521 Pain in right elbow: Secondary | ICD-10-CM

## 2015-06-23 DIAGNOSIS — M25621 Stiffness of right elbow, not elsewhere classified: Secondary | ICD-10-CM

## 2015-06-23 NOTE — Therapy (Addendum)
[place] [facility] [address], [place], [address] Phone: [phone]   Fax:  [phone]  Physical Therapy Treatment  Patient Details  Name: Sydney Soto MRN: [id number] Date of Birth: [birth year] Referring Provider: Dante Gang, PA  Encounter Date: 06/23/2015      PT End of Session - 06/23/15 1904    Visit Number 2   Number of Visits 12   Date for PT Re-Evaluation 07/30/15   PT Start Time 1630   PT Stop Time 1715   PT Time Calculation (min) 45 min   Activity Tolerance Patient tolerated treatment well   Behavior During Therapy [facility] for tasks assessed/performed      History reviewed. No pertinent past medical history.  Past Surgical History  Procedure Laterality Date  . Colonoscopy N/A 07/02/2014    Procedure: COLONOSCOPY;  Surgeon: Scot Jun, MD;  Location: [facility] ENDOSCOPY;  Service: Endoscopy;  Laterality: N/A;  . Esophagogastroduodenoscopy N/A 07/02/2014    Procedure: ESOPHAGOGASTRODUODENOSCOPY (EGD);  Surgeon: Scot Jun, MD;  Location[facility]OSCOPY;  Service: Endoscopy;  Laterality: N/A;    There were no vitals filed for this visit.      Subjective Assessment - 06/23/15 1638    Subjective Patient reports no change in pain after the initial evaluation. Patient states she continues to have deep pain into the elbow tendons.    Limitations House hold activities;Lifting;Other (comment)   Patient Stated Goals To be able to lift carry and stretch arm (extend elbow fully without pain).    Currently in Pain? Yes   Pain Score 8    Pain Location Elbow   Pain Orientation Right   Pain Descriptors / Indicators Aching;Burning   Pain Type Chronic pain;Acute pain   Pain Onset More than a month ago      Objective: Observation: Patinet presents with elbow constantly bent throughout treatment while positioned in sitting.  Palpation: Increased tenderness and pain over the lateral  epicondyle on the right elbow. Tender to palpation over the common wrist extensors and the muscle bellies throughout.    Special Testing: Grip strength with dynamometer: R 10#, 15#; L: 40#,30#   Modalities: Iontophoresis:right UE: 38mA*min dosage with one large electrode applied over the triceps tendon and one medium electrode placed over the extensor tendons right elbow with arm proped on a pillow for ~34min. Following application/instruction Skin checked with no adverse signs noted.   Patient response to treatment: No change in pain or dynamometry after iontophoresis but patient performed strength measurement with increased ease and with better quality.         PT Education - 06/23/15 1903    Education provided Yes   Education Details Educated to maintain her home program   Person(s) Educated Patient   Methods Explanation   Comprehension Verbalized understanding             PT Long Term Goals - 06/18/15 2015    PT LONG TERM GOAL #1   Title Patient will score a 40% or less on the QuickDASH by 07/30/15 demonstrating significant improvement with elbow function and ability to better perform lifitng activities.   Baseline QuickDASH score: 61% impaired   Status New   PT LONG TERM GOAL #2   Title Patient will be independent with exercise performance and progression focussed on improving muscular endurance, coordination, and strength by 07/30/15 to continue/maintain functional improvements after discharge from therapy.   Baseline Dependent with exercise performance and progression.  Status New   PT LONG TERM GOAL #3   Title Patient will demonstrate hand grip strength over 30# on the right UE by 07/30/15 to demostrate functional elbow improvement and improved ability to perform carrying activities.    Baseline R LE hand grip strength: 10#, 20#   Status New               Plan - 06/23/15 1908    Clinical Impression Statement Educated patient on Iontophoresis today and  patient demonstrated improved quality of hand grip strength demonstrating improved ease and speed with movement following iontophoresis treatment. Patient with no change in pain after iontophoresis, which is expected, and will take up to 4-6 sessions to determine amount of benefit. Plan to continues with further skilled therapy aimed at decreasing pain to allow for improved functional use right UE.     Rehab Potential Good   Clinical Impairments Affecting Rehab Potential (+): age, highly motivated; (-): chronicity of pain   PT Frequency 2x / week   PT Duration 6 weeks   PT Treatment/Interventions Patient/family education;Ultrasound;Dry needling;Moist Heat;Electrical Stimulation;Therapeutic exercise;Cryotherapy;Manual techniques;Iontophoresis 4mg /ml Dexamethasone;Therapeutic activities;Neuromuscular re-education   PT Next Visit Plan Pain control with modalities, wrist coordination, endurance exercises.   PT Home Exercise Plan Wrist flexion with overpressure, neutral wirst AROM, self massage to wrist extensors.   Consulted and Agree with Plan of Care Patient      Patient will benefit from skilled therapeutic intervention in order to improve the following deficits and impairments:  Decreased mobility, Decreased strength, Increased edema, Pain, Increased muscle spasms, Impaired UE functional use, Decreased endurance, Increased fascial restricitons, Decreased range of motion  Visit Diagnosis: Pain in right elbow  Stiffness of right elbow, not elsewhere classified     Problem List There are no active problems to display for this patient.   Myrene GalasWesley Joselyne Spake, SPT 06/23/2015, 7:18 PM  [place] [facility] [address] [place], [place], [address] Phone: [phone]   Fax:  [phone]  Name: Sharla KidneyCatherene H Fritsche MRN: [id number] Date of Birth: March 04, 1960

## 2015-06-25 ENCOUNTER — Ambulatory Visit: Payer: Managed Care, Other (non HMO) | Admitting: Physical Therapy

## 2015-06-25 DIAGNOSIS — M25521 Pain in right elbow: Secondary | ICD-10-CM | POA: Diagnosis not present

## 2015-06-25 DIAGNOSIS — M6281 Muscle weakness (generalized): Secondary | ICD-10-CM

## 2015-06-25 DIAGNOSIS — M25621 Stiffness of right elbow, not elsewhere classified: Secondary | ICD-10-CM

## 2015-06-25 NOTE — Therapy (Signed)
[place] [facility] [address] [place], [address] Phone: [phone]   Fax:  [phone]  Physical Therapy Treatment  Patient Details  Name: Sydney Soto MRN: [id number] Date of Birth: 1960/04/30 Referring Provider: Dante Gang, PA  Encounter Date: 06/25/2015      PT End of Session - 06/25/15 1555    Visit Number 3   Number of Visits 12   Date for PT Re-Evaluation 07/30/15   PT Start Time 1510   PT Stop Time 1545   PT Time Calculation (min) 35 min   Activity Tolerance Patient tolerated treatment well   Behavior During Therapy [facility] for tasks assessed/performed      No past medical history on file.  Past Surgical History  Procedure Laterality Date  . Colonoscopy N/A 07/02/2014    Procedure: COLONOSCOPY;  Surgeon: Scot Jun, MD;  Location: [facility] ENDOSCOPY;  Service: Endoscopy;  Laterality: N/A;  . Esophagogastroduodenoscopy N/A 07/02/2014    Procedure: ESOPHAGOGASTRODUODENOSCOPY (EGD);  Surgeon: Scot Jun, MD;  Location: [facility] ENDOSCOPY;  Service: Endoscopy;  Laterality: N/A;    There were no vitals filed for this visit.      Subjective Assessment - 06/25/15 1510    Subjective Patient reports she did feel some better with less tenderness in right elbow following iontophoresis treatment.    Limitations House hold activities;Lifting;Other (comment)   Patient Stated Goals To be able to lift carry and stretch arm (extend elbow fully without pain).    Currently in Pain? Yes   Pain Score 6    Pain Location Elbow   Pain Orientation Right   Pain Descriptors / Indicators Aching   Pain Type Chronic pain;Acute pain  acute on chronic pain   Pain Onset More than a month ago      Objective:  Palpation: Increased tenderness and pain over the lateral epicondyle and triceps tendon right elbow   Special Testing: Grip strength with dynamometer:  Pre treatment R 25#,  L:  40#   Modalities: Iontophoresis:right UE: 41mA*min dosage with one large electrode applied over the triceps tendon and one medium electrode placed over the extensor tendons right elbow with arm proped on a pillow for ~2min. Following application/instruction Skin checked with no adverse reactions noted.   Re assessed home exercises and precautions to avoid aggravating pain in right elbow (no gripping, carrying bags in right hand)  Patient response to treatment: No adverse reaction to ionto. Treatment, improved grip strength to 25# from previous session, good understanding of home program verbalized           PT Education - 06/25/15 1554    Education provided Yes   Education Details HEP: continue with stretches, use of ice and avoid gripping activities over the weekend   Person(s) Educated Patient   Methods Explanation   Comprehension Verbalized understanding             PT Long Term Goals - 06/18/15 2015    PT LONG TERM GOAL #1   Title Patient will score a 40% or less on the QuickDASH by 07/30/15 demonstrating significant improvement with elbow function and ability to better perform lifitng activities.   Baseline QuickDASH score: 61% impaired   Status New   PT LONG TERM GOAL #2   Title Patient will be independent with exercise performance and progression focussed on improving muscular endurance, coordination, and strength by 07/30/15 to continue/maintain functional improvements after discharge from therapy.   Baseline Dependent  with exercise performance and progression.    Status New   PT LONG TERM GOAL #3   Title Patient will demonstrate hand grip strength over 30# on the right UE by 07/30/15 to demostrate functional elbow improvement and improved ability to perform carrying activities.    Baseline R LE hand grip strength: 10#, 20#   Status New               Plan - 06/25/15 1556    Clinical Impression Statement Improved grip strength to 25# from previous session  demonstrating carry over of decreased pain and inflammation from previous treatment. She cointiues with difficulty with extendign and flexing her wrist.    PT Frequency 2x / week   PT Duration 6 weeks   PT Treatment/Interventions Patient/family education;Ultrasound;Dry needling;Moist Heat;Electrical Stimulation;Therapeutic exercise;Cryotherapy;Manual techniques;Iontophoresis 4mg /ml Dexamethasone;Therapeutic activities;Neuromuscular re-education   PT Next Visit Plan iointophoresis, exercises for stretching, strengthening   PT Home Exercise Plan Wrist flexion with overpressure, neutral wrist AROM, self massage to wrist extensors, ice as needed      Patient will benefit from skilled therapeutic intervention in order to improve the following deficits and impairments:  Decreased mobility, Decreased strength, Increased edema, Pain, Increased muscle spasms, Impaired UE functional use, Decreased endurance, Increased fascial restricitons, Decreased range of motion  Visit Diagnosis: Pain in right elbow  Stiffness of right elbow, not elsewhere classified  Muscle weakness (generalized)     Problem List There are no active problems to display for this patient.   [name], [name] PT 06/25/2015, 4:38 PM  [place] [facility] [address] [place], [place], [address] Phone: [phone]   Fax:  [phone]  Name: Sydney Soto MRN: [id number] Date of Birth: 12/25/1960

## 2015-06-30 ENCOUNTER — Ambulatory Visit: Payer: Managed Care, Other (non HMO) | Admitting: Physical Therapy

## 2015-07-01 ENCOUNTER — Encounter: Payer: Managed Care, Other (non HMO) | Admitting: Physical Therapy

## 2015-07-02 ENCOUNTER — Encounter: Payer: Managed Care, Other (non HMO) | Admitting: Physical Therapy

## 2015-07-02 ENCOUNTER — Encounter: Payer: Self-pay | Admitting: Physical Therapy

## 2015-07-02 ENCOUNTER — Ambulatory Visit: Payer: Managed Care, Other (non HMO) | Admitting: Physical Therapy

## 2015-07-02 DIAGNOSIS — M25621 Stiffness of right elbow, not elsewhere classified: Secondary | ICD-10-CM

## 2015-07-02 DIAGNOSIS — M25521 Pain in right elbow: Secondary | ICD-10-CM

## 2015-07-02 DIAGNOSIS — M6281 Muscle weakness (generalized): Secondary | ICD-10-CM

## 2015-07-02 NOTE — Therapy (Signed)
[place] [facility] [address], [place], [address] Phone: [phone]   Fax:  [phone]  Physical Therapy Treatment  Patient Details  Name: Sydney Soto MRN: [id number] Date of Birth: February 09, 1960 Referring Provider: Dante Gang, PA  Encounter Date: 07/02/2015      PT End of Session - 07/02/15 1716    Visit Number 4   Number of Visits 12   Date for PT Re-Evaluation 07/30/15   PT Start Time 1713   PT Stop Time 1810   PT Time Calculation (min) 57 min   Activity Tolerance Patient tolerated treatment well   Behavior During Therapy [facility] for tasks assessed/performed      History reviewed. No pertinent past medical history.  Past Surgical History  Procedure Laterality Date  . Colonoscopy N/A 07/02/2014    Procedure: COLONOSCOPY;  Surgeon: Scot Jun, MD;  Location: [facility] ENDOSCOPY;  Service: Endoscopy;  Laterality: N/A;  . Esophagogastroduodenoscopy N/A 07/02/2014    Procedure: ESOPHAGOGASTRODUODENOSCOPY (EGD);  Surgeon: Scot Jun, MD;  Location: [facility] ENDOSCOPY;  Service: Endoscopy;  Laterality: N/A;    There were no vitals filed for this visit.      Subjective Assessment - 07/02/15 1711    Subjective Patient reports she did not notice a whole lot of difference in her right elbow pain last session. Chief complaint is pain with right elbow extension.    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    Limitations House hold activities;Lifting;Other (comment)   Patient Stated Goals To be able to lift carry and stretch arm (extend elbow fully without pain).    Currently in Pain? Yes   Pain Score 5    Pain Location Elbow   Pain Orientation Right   Pain Descriptors / Indicators Aching   Pain Type Chronic pain;Acute pain  acute on chronic pain   Pain Onset More  than a month ago     Objective:  Palpation: Increased tenderness and pain over the lateral epicondyle and triceps tendon with + spasms palpable into right forearm muscles of right UE   Special Testing: Grip strength with dynamometer: Pre treatment : left 40#, 38#; right 25#.27#  Treatment:  Manual therapy:  STM performed along right UE triceps tendon and lateral aspect right elbow extensor tendons and muscles along forearm with patient seated with UE resting on pilllow  Modalities: Ultrasound: continuous @ 1.3w/cm2 performed x 10 min. To right UE triceps tendon, elbow into right forearm extensor tendons/muslces Iontphoresis:right UE: Dexamethasone /ml @ 60 mA-min: therapist applied one large electrode over the triceps tendon and one medium electrode over the extensor tendons right elbow with arm proped on a pillow for ~33min. Following application/instruction Skin checked with no adverse reactions noted.   Re assessed home exercises and precautions to avoid aggravating pain in right elbow (no gripping, carrying bags in right hand)  Patient response to treatment: No adverse reaction to ionto. Treatment, improved grip strength to 27# from previous session, good understanding of home program verbalized. Improved soft tissue elasticity right UE forearm muscles and triceps tendon.         PT Long Term Goals - 06/18/15 2015    PT LONG TERM GOAL #1   Title Patient will score a 40% or less on the QuickDASH by 07/30/15 demonstrating significant improvement with elbow function and ability to better perform lifitng  activities.   Baseline QuickDASH score: 61% impaired   Status New   PT LONG TERM GOAL #2   Title Patient will be independent with exercise performance and progression focussed on improving muscular endurance, coordination, and strength by 07/30/15 to continue/maintain functional improvements after discharge from therapy.   Baseline Dependent with exercise performance and  progression.    Status New   PT LONG TERM GOAL #3   Title Patient will demonstrate hand grip strength over 30# on the right UE by 07/30/15 to demostrate functional elbow improvement and improved ability to perform carrying activities.    Baseline R LE hand grip strength: 10#, 20#   Status New               Plan - 07/02/15 1718    Clinical Impression Statement Improved grip strength to 27# from previous session demonstrating carry over of decreased pain and inflammation from previous treatment. She continues with difficulty with extendign her elbow due to pain.    Rehab Potential Good   PT Frequency 2x / week   PT Duration 6 weeks   PT Treatment/Interventions Patient/family education;Ultrasound;Dry needling;Moist Heat;Electrical Stimulation;Therapeutic exercise;Cryotherapy;Manual techniques;Iontophoresis 4mg /ml Dexamethasone;Therapeutic activities;Neuromuscular re-education   PT Next Visit Plan iointophoresis, exercises for stretching, strengthening   PT Home Exercise Plan Wrist flexion with overpressure, neutral wrist AROM, self massage to wrist extensors, ice as needed      Patient will benefit from skilled therapeutic intervention in order to improve the following deficits and impairments:  Decreased mobility, Decreased strength, Increased edema, Pain, Increased muscle spasms, Impaired UE functional use, Decreased endurance, Increased fascial restricitons, Decreased range of motion  Visit Diagnosis: Pain in right elbow  Stiffness of right elbow, not elsewhere classified  Muscle weakness (generalized)     Problem List There are no active problems to display for this patient.   [name],  PT 07/02/2015, 9:15 PM  [place] [facility] [address] [place], [place], [address] Phone: [phone]   Fax:  [phone]  Name: Sydney Soto MRN: [id number] Date of Birth: [birth year]

## 2015-07-07 ENCOUNTER — Ambulatory Visit: Payer: Managed Care, Other (non HMO) | Admitting: Physical Therapy

## 2015-07-07 ENCOUNTER — Encounter: Payer: Self-pay | Admitting: Physical Therapy

## 2015-07-07 DIAGNOSIS — M6281 Muscle weakness (generalized): Secondary | ICD-10-CM

## 2015-07-07 DIAGNOSIS — M25621 Stiffness of right elbow, not elsewhere classified: Secondary | ICD-10-CM

## 2015-07-07 DIAGNOSIS — M25521 Pain in right elbow: Secondary | ICD-10-CM

## 2015-07-07 NOTE — Therapy (Signed)
[place] [facility] [address] [place], [place], [address] Phone: [phone]   Fax:  [phone]  Physical Therapy Treatment  Patient Details  Name: Sydney Soto MRN: [id number] Date of Birth: 12/07/1960 Referring Provider: Dante GangJonathan T Mundy, PA  Encounter Date: 07/07/2015      PT End of Session - 07/07/15 2138    Visit Number 5   Number of Visits 12   Date for PT Re-Evaluation 07/30/15   PT Start Time 1752   PT Stop Time 1900   PT Time Calculation (min) 68 min   Activity Tolerance Patient tolerated treatment well   Behavior During Therapy [facility] for tasks assessed/performed      History reviewed. No pertinent past medical history.  Past Surgical History  Procedure Laterality Date  . Colonoscopy N/A 07/02/2014    Procedure: COLONOSCOPY;  Surgeon: Scot Junobert T Elliott, MD;  [facility];  Service: Endoscopy;  Laterality: N/A;  . Esophagogastroduodenoscopy N/A 07/02/2014    Procedure: ESOPHAGOGASTRODUODENOSCOPY (EGD);  Surgeon: Scot Junobert T Elliott, MD;  [facility];  Service: Endoscopy;  Laterality: N/A;    There were no vitals filed for this visit.      Subjective Assessment - 07/07/15 1752    Subjective Patient reports her right elbow is hurting "bad".  She is having pain with straightening her elbow and it is affecting her sleep.    Limitations House hold activities;Lifting;Other (comment)   Patient Stated Goals To be able to lift carry and stretch arm (extend elbow fully without pain).    Currently in Pain? Yes   Pain Score 3   increased with movement to 8/10    Pain Location Elbow   Pain Orientation Right   Pain Descriptors / Indicators Aching;Burning   Pain Type Other (Comment)  acute on chronic   Pain Onset More than a month ago   Pain Frequency Constant      Objective:  Palpation: Right UE:  Increased tenderness and pain over the lateral epicondyle and triceps tendon with  + spasms palpable into right forearm muscles of right UE   Strength:  Grip strength with dynamometer: Pre treatment :on arrival left 30#, right 20#  Treatment:  Manual therapy:  STM performed along right UE triceps tendon and lateral aspect right elbow extensor tendons and muscles along forearm with patient seated with UE resting on pilllow  Modalities: Ultrasound: pulsed 20% @ 0.8w/cm2 3MHz performed x 10 min. To right UE triceps tendon, elbow into right forearm extensor tendons/muslces over trigger points  Iontphoresis:right UE: Dexamethasone 4mg /ml @ 70 mA-min: therapist applied one large electrode over the triceps tendon and one large electrode over the extensor tendons right elbow with arm proped on a pillow for ~3030min. Following application/instruction Skin checked with no adverse reactions noted.   Re assessed home exercises and precautions to avoid aggravating pain in right elbow (no gripping, carrying bags in right hand and no holding right elbow in static flexion/extension for extended periods of time)  Patient response to treatment: No adverse reaction to ionto. Treatment, no significant change in grip strength with treatment or from previous session, good understanding of home program verbalized. Improved soft tissue elasticity right UE forearm muscles and triceps tendon following US and STM         PT Education - 07/07/15 1730    Education provided Yes   Education Details HEP and instructed in healing itme frames for chronic pain and inflammation   Person(s) Educated Patient  Methods Explanation   Comprehension Verbalized understanding             PT Long Term Goals - 06/18/15 2015    PT LONG TERM GOAL #1   Title Patient will score a 40% or less on the QuickDASH by 07/30/15 demonstrating significant improvement with elbow function and ability to better perform lifitng activities.   Baseline QuickDASH score: 61% impaired   Status New   PT LONG TERM GOAL #2    Title Patient will be independent with exercise performance and progression focussed on improving muscular endurance, coordination, and strength by 07/30/15 to continue/maintain functional improvements after discharge from therapy.   Baseline Dependent with exercise performance and progression.    Status New   PT LONG TERM GOAL #3   Title Patient will demonstrate hand grip strength over 30# on the right UE by 07/30/15 to demostrate functional elbow improvement and improved ability to perform carrying activities.    Baseline R LE hand grip strength: 10#, 20#   Status New               Plan - 07/07/15 2140    Clinical Impression Statement Patient continues with pain and limitations in ability to extend elbow or flex elbow after having it in one position for prolonged period of time such as with sleeping position. She demonstrated decreased spasms and improved soft tissue elasticity. She continues with pain and stiffenss in right elbow that requires additional physical therapy intervention to achieve goals for imporved function and decreased pain.    Rehab Potential Good   PT Frequency 2x / week   PT Duration 6 weeks   PT Treatment/Interventions Patient/family education;Ultrasound;Dry needling;Moist Heat;Electrical Stimulation;Therapeutic exercise;Cryotherapy;Manual techniques;Iontophoresis /ml Dexamethasone;Therapeutic activities;Neuromuscular re-education   PT Next Visit Plan iointophoresis, exercises for stretching, strengthening   PT Home Exercise Plan Wrist flexion with overpressure, neutral wrist AROM, self massage to wrist extensors, ice as needed      Patient will benefit from skilled therapeutic intervention in order to improve the following deficits and impairments:  Decreased mobility, Decreased strength, Increased edema, Pain, Increased muscle spasms, Impaired UE functional use, Decreased endurance, Increased fascial restricitons, Decreased range of motion  Visit  Diagnosis: Pain in right elbow  Stiffness of right elbow, not elsewhere classified  Muscle weakness (generalized)     Problem List There are no active problems to display for this patient.   Beacher May PT 07/07/2015, 9:50 PM  [place] [facility] PHYSICAL AND SPORTS MEDICINE [address], [place], [address] Phone: [phone]   Fax:  [phone]  Name: Sydney Soto MRN: [id number] Date of Birth: 12-16-1960

## 2015-07-08 ENCOUNTER — Ambulatory Visit: Payer: Managed Care, Other (non HMO) | Admitting: Physical Therapy

## 2015-07-09 ENCOUNTER — Ambulatory Visit: Payer: Managed Care, Other (non HMO) | Attending: Neurological Surgery | Admitting: Physical Therapy

## 2015-07-09 ENCOUNTER — Encounter: Payer: Self-pay | Admitting: Physical Therapy

## 2015-07-09 DIAGNOSIS — M25521 Pain in right elbow: Secondary | ICD-10-CM | POA: Insufficient documentation

## 2015-07-09 DIAGNOSIS — M6281 Muscle weakness (generalized): Secondary | ICD-10-CM | POA: Diagnosis present

## 2015-07-09 DIAGNOSIS — M25621 Stiffness of right elbow, not elsewhere classified: Secondary | ICD-10-CM

## 2015-07-10 NOTE — Therapy (Signed)
[facility] [address] [place], [address] Phone: [phone]   Fax:  [phone]  Physical Therapy Treatment  Patient Details  Name: Sydney Soto MRN: [id number] Date of Birth: Jun 26, 1960 Referring Provider: Dante Gang, PA  Encounter Date: 07/09/2015      PT End of Session - 07/09/15 1833    Visit Number 6   Number of Visits 12   Date for PT Re-Evaluation 07/30/15   PT Start Time 1703   PT Stop Time 1812   PT Time Calculation (min) 69 min   Activity Tolerance Patient tolerated treatment well   Behavior During Therapy [facility] for tasks assessed/performed      History reviewed. No pertinent past medical history.  Past Surgical History  Procedure Laterality Date  . Colonoscopy N/A 07/02/2014    Procedure: COLONOSCOPY;  Surgeon: Scot Jun, MD;  Location: [facility] ENDOSCOPY;  Service: Endoscopy;  Laterality: N/A;  . Esophagogastroduodenoscopy N/A 07/02/2014    Procedure: ESOPHAGOGASTRODUODENOSCOPY (EGD);  Surgeon: Scot Jun, MD;  Location: [facility] ENDOSCOPY;  Service: Endoscopy;  Laterality: N/A;    There were no vitals filed for this visit.      Subjective Assessment - 07/09/15 1758    Subjective Patient reports she is still having pain in right elbow, worse with extension and flexion after prolonged static hold in one position    Limitations House hold activities;Lifting;Other (comment)   Patient Stated Goals To be able to lift carry and stretch arm (extend elbow fully without pain).    Currently in Pain? Yes   Pain Score 3   increased pain to 9/10 with performing elbow extension   Pain Location Elbow   Pain Orientation Right   Pain Descriptors / Indicators Aching;Burning   Pain Type Acute pain;Chronic pain   Pain Onset More than a month ago   Pain Frequency Constant        Objective:  Palpation: Right UE: Increased tenderness and pain over the lateral epicondyle and  triceps tendon with + spasms palpable into right forearm muscles of right UE (decreased spasms as compared to previous session)   Strength:  Grip strength with dynamometer: Pre treatment :on arrival left 30#, 35#, right 27#, 18# (with right elbow pain reported)  Treatment:  Manual therapy:  STM performed along right UE triceps tendon and lateral aspect right elbow extensor tendons and muscles along forearm with patient seated with UE resting on pillow  Exercise: Seated: manual resistive exercises for right elbow flexion and extension: multi angle isometrics 3 sets performed for each motion followed by AAROM and AROM through full ROM with caution at end range extension to avoid increased pain; instructed in home program for right elbow extension exercises with written program given  Modalities: Ultrasound: pulsed 50% @ 1.3w/cm2 performed x 10 min. To right UE triceps tendon, elbow into right forearm extensor tendons/muslces over trigger points  Iontphoresis:right UE: Dexamethasone /ml @ 70 mA-min: therapist applied one large electrode over the triceps tendon and one large electrode over the extensor tendons right elbow with arm proped on a pillow for ~37min. Following application/instruction Skin checked with no adverse reactions noted.   Re assessed home exercises and precautions to avoid aggravating pain in right elbow (no gripping, carrying bags in right hand and no holding right elbow in static flexion/extension for extended periods of time)  Patient response to treatment: No adverse reaction to ionto. Treatment, no significant change in grip strength with treatment  or from previous session, good understanding of home program verbalized. Improved soft tissue elasticity right UE forearm muscles and triceps tendon following US and STM, full strength noted with isometric exercises without aggravation of elbow symptoms; continued with pain with end range active and passive elbow  extension (posteromedial aspect)            PT Education - 07/09/15 1759    Education provided Yes   Education Details HEP isometric/isotonic elbow extension   Person(s) Educated Patient   Methods Explanation;Demonstration;Verbal cues;Other (comment)   Comprehension Verbalized understanding;Returned demonstration;Verbal cues required             PT Long Term Goals - 06/18/15 2015    PT LONG TERM GOAL #1   Title Patient will score a 40% or less on the QuickDASH by 07/30/15 demonstrating significant improvement with elbow function and ability to better perform lifitng activities.   Baseline QuickDASH score: 61% impaired   Status New   PT LONG TERM GOAL #2   Title Patient will be independent with exercise performance and progression focussed on improving muscular endurance, coordination, and strength by 07/30/15 to continue/maintain functional improvements after discharge from therapy.   Baseline Dependent with exercise performance and progression.    Status New   PT LONG TERM GOAL #3   Title Patient will demonstrate hand grip strength over 30# on the right UE by 07/30/15 to demostrate functional elbow improvement and improved ability to perform carrying activities.    Baseline R LE hand grip strength: 10#, 20#   Status New               Plan - 07/09/15 1835    Clinical Impression Statement Patient tolerated treatment well and was able to perform exercises for right elbow with minimal discomfort. She continues with deep pain in her elbow which limits her abiltiy to perform exercises and extension motion of right elbow. She continues to benefit from physical therapy intervention for pain control and guidance for appropriate progression of exercises in order to return to full functional use of right UE.    Rehab Potential Good   PT Frequency 2x / week   PT Duration 6 weeks   PT Treatment/Interventions Patient/family education;Ultrasound;Dry needling;Moist Heat;Electrical  Stimulation;Therapeutic exercise;Cryotherapy;Manual techniques;Iontophoresis 4mg /ml Dexamethasone;Therapeutic activities;Neuromuscular re-education   PT Next Visit Plan iointophoresis, exercises for stretching, strengthening   PT Home Exercise Plan Wrist flexion with overpressure, neutral wrist AROM, self massage to wrist extensors, ice as needed      Patient will benefit from skilled therapeutic intervention in order to improve the following deficits and impairments:  Decreased mobility, Decreased strength, Increased edema, Pain, Increased muscle spasms, Impaired UE functional use, Decreased endurance, Increased fascial restricitons, Decreased range of motion  Visit Diagnosis: Pain in right elbow  Stiffness of right elbow, not elsewhere classified  Muscle weakness (generalized)     Problem List There are no active problems to display for this patient.   [name], [name] PT 07/10/2015, 1:40 PM  [facility] [address] [place], [place], [id number] Phone: [phone]   Fax:  [phone]  Name: Sydney Soto MRN: [id number] Date of Birth: Nov 02, 1960

## 2015-07-14 ENCOUNTER — Ambulatory Visit: Payer: Managed Care, Other (non HMO) | Admitting: Physical Therapy

## 2015-07-16 ENCOUNTER — Encounter: Payer: Managed Care, Other (non HMO) | Admitting: Physical Therapy

## 2015-07-21 ENCOUNTER — Encounter: Payer: Managed Care, Other (non HMO) | Admitting: Physical Therapy

## 2015-07-23 ENCOUNTER — Encounter: Payer: Managed Care, Other (non HMO) | Admitting: Physical Therapy

## 2015-07-28 ENCOUNTER — Encounter: Payer: Managed Care, Other (non HMO) | Admitting: Physical Therapy

## 2015-07-30 ENCOUNTER — Encounter: Payer: Managed Care, Other (non HMO) | Admitting: Physical Therapy

## 2015-08-04 ENCOUNTER — Encounter: Payer: Managed Care, Other (non HMO) | Admitting: Physical Therapy

## 2015-08-06 ENCOUNTER — Encounter: Payer: Managed Care, Other (non HMO) | Admitting: Physical Therapy

## 2015-08-10 ENCOUNTER — Encounter: Payer: Managed Care, Other (non HMO) | Admitting: Physical Therapy

## 2015-08-13 ENCOUNTER — Encounter: Payer: Managed Care, Other (non HMO) | Admitting: Physical Therapy

## 2016-03-24 ENCOUNTER — Encounter: Payer: Self-pay | Admitting: Obstetrics and Gynecology

## 2016-03-24 ENCOUNTER — Ambulatory Visit (INDEPENDENT_AMBULATORY_CARE_PROVIDER_SITE_OTHER): Payer: Managed Care, Other (non HMO) | Admitting: Obstetrics and Gynecology

## 2016-03-24 VITALS — BP 123/84 | HR 98 | Ht 65.0 in | Wt 220.0 lb

## 2016-03-24 DIAGNOSIS — Z800 Family history of malignant neoplasm of digestive organs: Secondary | ICD-10-CM | POA: Diagnosis not present

## 2016-03-24 DIAGNOSIS — R102 Pelvic and perineal pain: Secondary | ICD-10-CM | POA: Diagnosis not present

## 2016-03-24 LAB — POCT URINALYSIS DIPSTICK
Bilirubin, UA: NEGATIVE
GLUCOSE UA: NEGATIVE
Ketones, UA: NEGATIVE
LEUKOCYTES UA: NEGATIVE
NITRITE UA: NEGATIVE
Protein, UA: NEGATIVE
RBC UA: NEGATIVE
Spec Grav, UA: 1.02
UROBILINOGEN UA: NEGATIVE
pH, UA: 5

## 2016-03-24 NOTE — Addendum Note (Signed)
Addended by: Brooke DareSICK, Ragan Reale L on: 03/24/2016 04:25 PM   Modules accepted: Orders

## 2016-03-24 NOTE — Progress Notes (Signed)
HPI:      [name] is a 56 y.o. [phone] who LMP was No LMP recorded. Patient has had a hysterectomy.  Subjective: She presents today with complaint of 1 week of left-sided pelvic pressure symptoms. She also states that she believes she had a small amount of vaginal spotting in the last week. Patient had a previous hysterectomy for dysfunctional bleeding 20 years ago. Patient's mother had colon cancer.    Hx: The following portions of the patient's history were reviewed and updated as appropriate:            She  has a past medical history of Anxiety. She  does not have a problem list on file. She  has a past surgical history that includes Colonoscopy (N/A, 07/02/2014) and Esophagogastroduodenoscopy (N/A, 07/02/2014). Her family history includes Cancer in her mother; Diabetes in her brother and father; Osteoarthritis in her father and mother; Thyroid disease in her sister. She  reports that she has never smoked. She has never used smokeless tobacco. She reports that she does not drink alcohol or use drugs. Current Outpatient Prescriptions on File Prior to Visit  Medication Sig Dispense Refill  . acetaminophen (TYLENOL) 500 MG tablet Take 1,000 mg by mouth every 6 (six) hours as needed for moderate pain.    Marland Kitchen aspirin 325 MG tablet Take 325 mg by mouth daily.    . cyanocobalamin (,VITAMIN B-12,) 1000 MCG/ML injection Inject 1 mL into the muscle every 30 (thirty) days.    . DULoxetine (CYMBALTA) 30 MG capsule Take 30-60 mg by mouth 2 (two) times daily. 2 cap in the morning and 1 cap at night    . magnesium oxide (MAG-OX) 400 MG tablet Take 400 mg by mouth daily.    . Vitamin D, Ergocalciferol, (DRISDOL) 50000 UNITS CAPS capsule Take 5,000 Units by mouth daily.     No current facility-administered medications on file prior to visit.           ROS: Constitutional: Denied constitutional symptoms, night sweats, recent illness, fatigue, fever, insomnia and weight loss.  Eyes:  Denied eye symptoms, eye pain, photophobia, vision change and visual disturbance.  Ears/Nose/Throat/Neck: Denied ear, nose, throat or neck symptoms, hearing loss, nasal discharge, sinus congestion and sore throat.  Cardiovascular: Denied cardiovascular symptoms, arrhythmia, chest pain/pressure, edema, exercise intolerance, orthopnea and palpitations.  Respiratory: Denied pulmonary symptoms, asthma, pleuritic pain, productive sputum, cough, dyspnea and wheezing.  Gastrointestinal: Denied, gastro-esophageal reflux, melena, nausea and vomiting.  Genitourinary: See HPI for additional information.  Musculoskeletal: Denied musculoskeletal symptoms, stiffness, swelling, muscle weakness and myalgia.  Dermatologic: Denied dermatology symptoms, rash and scar.  Neurologic: Denied neurology symptoms, dizziness, headache, neck pain and syncope.  Psychiatric: Denied psychiatric symptoms, anxiety and depression.  Endocrine: Denied endocrine symptoms including hot flashes and night sweats.   Meds: She has a current medication list which includes the following prescription(s): acetaminophen, aspirin, atorvastatin, bupropion, cyanocobalamin, duloxetine, magnesium oxide, omeprazole, and vitamin d (ergocalciferol).  Objective: Vitals:   03/24/16 1501  BP: 123/84  Pulse: 98   Physical examination ABD:  Both right and left sided pain with deep palpation left and right lower quadrants. Lateral to site of inguinal hernia. No true guarding or rebound tenderness. No masses are palpated.    Pelvic:   Vulva: Normal appearance.  No lesions.   Vagina: No lesions or abnormalities noted. Mild vaginal atrophy no areas of current spotting or bleeding identified.   Support: Normal pelvic support.  Urethra No masses tenderness or  scarring.  Meatus Normal size without lesions or prolapse.  Cervix: Surgically absent   Anus: Normal exam.  No lesions.  Perineum: Normal exam.  No lesions.        Bimanual   Uterus:  Surgically absent   Adnexae: No masses.   Minimally -tender to palpation.  Cul-de-sac: Negative for abnormality.  UA performed-negative for blood.  Assessment: 1. Pelvic pain in female   2. Family history of colon cancer in mother     Patient with recent onset of pelvic pressure left greater than right. Family history of colon cancer. Plan:             1.  Pelvic ultrasound tension ovaries-Doppler flow.   2.  Discussed possible genetic testing BRCA etc. literature given patient will consider this option.    3.  Patient has had a colonoscopy 5 years ago but is now due for her second. I recommend that she seek and get this performed.  Orders Orders Placed This Encounter  Procedures  . US Pelvis Complete     Meds ordered this encounter  Medications  . atorvastatin (LIPITOR) 10 MG tablet    Sig: TK 1 T PO  ONCE D    Refill:  3  . buPROPion (WELLBUTRIN SR) 150 MG 12 hr tablet    Sig: TK 1 T PO 2 TIMES D    Refill:  3  . omeprazole (PRILOSEC) 40 MG capsule    Sig: Take 40 mg by mouth.        F/U  Return for We will contact her with any abnormal test results.  Finis Bud, M.D. 03/24/2016 4:13 PM

## 2016-03-25 ENCOUNTER — Ambulatory Visit (INDEPENDENT_AMBULATORY_CARE_PROVIDER_SITE_OTHER): Payer: Managed Care, Other (non HMO)

## 2016-03-25 DIAGNOSIS — Z800 Family history of malignant neoplasm of digestive organs: Secondary | ICD-10-CM

## 2016-03-25 DIAGNOSIS — R102 Pelvic and perineal pain: Secondary | ICD-10-CM | POA: Diagnosis not present

## 2016-03-28 ENCOUNTER — Telehealth: Payer: Self-pay

## 2016-03-28 NOTE — Telephone Encounter (Signed)
Spoke with patient- negative results given per provider

## 2016-03-28 NOTE — Telephone Encounter (Signed)
-----   Message from Linzie Collinavid James Evans, MD sent at 03/28/2016  1:39 PM EST ----- Normal urine

## 2016-03-29 ENCOUNTER — Other Ambulatory Visit: Payer: Self-pay | Admitting: Internal Medicine

## 2016-03-29 DIAGNOSIS — R109 Unspecified abdominal pain: Secondary | ICD-10-CM

## 2016-04-05 ENCOUNTER — Ambulatory Visit
Admission: RE | Admit: 2016-04-05 | Discharge: 2016-04-05 | Disposition: A | Discharge status: Home / Self Care | Payer: Managed Care, Other (non HMO) | Source: Ambulatory Visit | Attending: Internal Medicine | Admitting: Internal Medicine

## 2016-04-05 DIAGNOSIS — R109 Unspecified abdominal pain: Secondary | ICD-10-CM | POA: Diagnosis present

## 2016-04-05 MED ORDER — IOPAMIDOL (ISOVUE-300) INJECTION 61%
100.0000 mL | Freq: Once | INTRAVENOUS | Status: AC | PRN
Start: 2016-04-05 — End: 2016-04-05
  Administered 2016-04-05: 100 mL via INTRAVENOUS

## 2016-04-08 DIAGNOSIS — K581 Irritable bowel syndrome with constipation: Secondary | ICD-10-CM | POA: Insufficient documentation

## 2016-07-13 ENCOUNTER — Encounter: Payer: Self-pay | Admitting: Obstetrics and Gynecology

## 2016-07-13 ENCOUNTER — Ambulatory Visit (INDEPENDENT_AMBULATORY_CARE_PROVIDER_SITE_OTHER): Payer: Managed Care, Other (non HMO) | Admitting: Obstetrics and Gynecology

## 2016-07-13 VITALS — BP 134/78 | HR 84 | Ht 65.0 in | Wt 226.5 lb

## 2016-07-13 DIAGNOSIS — N951 Menopausal and female climacteric states: Secondary | ICD-10-CM

## 2016-07-13 DIAGNOSIS — Z800 Family history of malignant neoplasm of digestive organs: Secondary | ICD-10-CM

## 2016-07-13 DIAGNOSIS — Z6837 Body mass index (BMI) 37.0-37.9, adult: Secondary | ICD-10-CM | POA: Diagnosis not present

## 2016-07-13 DIAGNOSIS — Z1231 Encounter for screening mammogram for malignant neoplasm of breast: Secondary | ICD-10-CM

## 2016-07-13 DIAGNOSIS — Z86711 Personal history of pulmonary embolism: Secondary | ICD-10-CM

## 2016-07-13 DIAGNOSIS — Z01419 Encounter for gynecological examination (general) (routine) without abnormal findings: Secondary | ICD-10-CM

## 2016-07-13 NOTE — Progress Notes (Signed)
ANNUAL PREVENTATIVE CARE GYNECOLOGY  ENCOUNTER NOTE  Subjective:       Sydney Soto is a 56 y.o. [phone] female here for a routine annual gynecologic exam. The patient is sexually active. The patient is not taking hormone replacement therapy. Patient denies post-menopausal vaginal bleeding. The patient wears seatbelts: yes. The patient participates in regular exercise: no. Has the patient ever been transfused or tattooed?: no. The patient reports that there is not domestic violence in her life.  Current complaints: 1.  None   Gynecologic History No LMP recorded. Patient has had a hysterectomy. Contraception: status post hysterectomy Last Pap: 2017. Results were: normal Last mammogram: 05/2015. Results were: normal Last Colonoscopy: 3-4 years ago.  Gets screened q 5 years due to family history of colon cancer.  Last Dexa Scan: None   Obstetric History OB History  Gravida Para Term Preterm AB Living  2 2 2     2   SAB TAB Ectopic Multiple Live Births          2    # Outcome Date GA Lbr Len/2nd Weight Sex Delivery Anes PTL Lv  2 Term 1997    F Vag-Spont   LIV  1 Term 421993    F Vag-Spont   LIV      Past Medical History:  Diagnosis Date  . Anxiety   . Pulmonary embolism (HCC)    Bilateral     Family History  Problem Relation Age of Onset  . Osteoarthritis Mother   . Colon cancer Mother   . Osteoarthritis Father   . Diabetes Father   . Thyroid disease Sister   . Diabetes Brother   . Breast cancer Neg Hx   . Ovarian cancer Neg Hx     Past Surgical History:  Procedure Laterality Date  . COLONOSCOPY N/A 07/02/2014   Procedure: COLONOSCOPY;  Surgeon: Scot Junobert T Elliott, MD;  [facility];  Service: Endoscopy;  Laterality: N/A;  . ESOPHAGOGASTRODUODENOSCOPY N/A 07/02/2014   Procedure: ESOPHAGOGASTRODUODENOSCOPY (EGD);  Surgeon: Scot Junobert T Elliott, MD;  [facility];  Service: Endoscopy;  Laterality: N/A;    Social History   Social History    . Marital status: Married    Spouse name: N/A  . Number of children: N/A  . Years of education: N/A   Occupational History  . Not on file.   Social History Main Topics  . Smoking status: Never Smoker  . Smokeless tobacco: Never Used  . Alcohol use No  . Drug use: No  . Sexual activity: Yes    Birth control/ protection: Surgical   Other Topics Concern  . Not on file   Social History Narrative  . No narrative on file    Current Outpatient Prescriptions on File Prior to Visit  Medication Sig Dispense Refill  . aspirin 325 MG tablet Take 325 mg by mouth daily.    Marland Kitchen. atorvastatin (LIPITOR) 10 MG tablet TK 1 T PO  ONCE D  3  . buPROPion (WELLBUTRIN SR) 150 MG 12 hr tablet TK 1 T PO 2 TIMES D  3  . cyanocobalamin (,VITAMIN B-12,) 1000 MCG/ML injection Inject 1 mL into the muscle every 30 (thirty) days.    . DULoxetine (CYMBALTA) 30 MG capsule Take 30-60 mg by mouth 2 (two) times daily. 2 cap in the morning and 1 cap at night    . magnesium oxide (MAG-OX) 400 MG tablet Take 400 mg by mouth daily.    . Vitamin D, Ergocalciferol, (DRISDOL)  50000 UNITS CAPS capsule Take 5,000 Units by mouth daily.     No current facility-administered medications on file prior to visit.     Allergies  Allergen Reactions  . Erythromycin Nausea Only    Review of Systems ROS Review of Systems - General ROS: negative for - chills, fatigue, fever, hot flashes, night sweats, weight gain or weight loss Psychological ROS: negative for - anxiety, decreased libido, depression, mood swings, physical abuse or sexual abuse Ophthalmic ROS: negative for - blurry vision, eye pain or loss of vision ENT ROS: negative for - headaches, hearing change, visual changes or vocal changes Allergy and Immunology ROS: negative for - hives, itchy/watery eyes or seasonal allergies Hematological and Lymphatic ROS: negative for - bleeding problems, bruising, swollen lymph nodes or weight loss Endocrine ROS: negative for -  galactorrhea, hair pattern changes, hot flashes, malaise/lethargy, mood swings, palpitations, polydipsia/polyuria, skin changes, temperature intolerance or unexpected weight changes Breast ROS: negative for - new or changing breast lumps or nipple discharge Respiratory ROS: negative for - cough or shortness of breath Cardiovascular ROS: negative for - chest pain, irregular heartbeat, palpitations or shortness of breath Gastrointestinal ROS: no abdominal pain, change in bowel habits, or black or bloody stools Genito-Urinary ROS: no dysuria, trouble voiding, or hematuria.  Positive for vasomotor symptoms (hot flushes ~ 2 times daily) Musculoskeletal ROS: negative for - joint pain or joint stiffness Neurological ROS: negative for - bowel and bladder control changes Dermatological ROS: negative for rash and skin changes.  Positive for folliculitis in pubic region.  Has been present x 2 weeks, occasional drainage.    Objective:   BP 134/78 (BP Location: Left Arm, Patient Position: Sitting, Cuff Size: Large)   Pulse 84   Ht 5\' 5"  (1.651 m)   Wt 226 lb 8 oz (102.7 kg)   BMI 37.69 kg/m  CONSTITUTIONAL: Well-developed, well-nourished female in no acute distress. Moderate obesity PSYCHIATRIC: Normal mood and affect. Normal behavior. Normal judgment and thought content. NEUROLGIC: Alert and oriented to person, place, and time. Normal muscle tone coordination. No cranial nerve deficit noted. HENT:  Normocephalic, atraumatic, External right and left ear normal. Oropharynx is clear and moist EYES: Conjunctivae and EOM are normal. Pupils are equal, round, and reactive to light. No scleral icterus.  NECK: Normal range of motion, supple, no masses.  Normal thyroid.  SKIN: Skin is warm and dry. No rash noted. Not diaphoretic. No erythema. No pallor. CARDIOVASCULAR: Normal heart rate noted, regular rhythm, no murmur.   RESPIRATORY: Clear to auscultation bilaterally. Effort and breath sounds normal, no  problems with respiration noted. BREASTS: Symmetric in size. No masses, skin changes, nipple drainage, or lymphadenopathy. ABDOMEN: Soft, normal bowel sounds, no distention noted.  No tenderness, rebound or guarding.  BLADDER: Normal PELVIC:  Bladder no bladder distension noted  Urethra: normal appearing urethra with no masses, tenderness or lesions  Vulva: vulvar lesion on right labia consistent with folliculitis, with small excoriation. No drainage noted, no erythema, mildly tender.   Vagina: normal appearing vagina with normal color and discharge, no lesions  Cervix: surgically absent  Uterus: surgically absent, vaginal cuff well healed  Adnexa: normal adnexa in size, nontender and no masses  RV: External Exam NormaI, No Rectal Masses and Normal Sphincter tone  MUSCULOSKELETAL: Normal range of motion. No tenderness.  No cyanosis, clubbing, or edema.  2+ distal pulses. LYMPHATIC: No Axillary, Supraclavicular, or Inguinal Adenopathy.   Labs:  Lab Results  Component Value Date   WBC 11.0 06/26/2013  HGB 12.6 06/26/2013   HCT 38.3 06/26/2013   MCV 92 06/26/2013   PLT 241 06/26/2013     Chemistry      Component Value Date/Time   NA 140 06/26/2013 2231   K 3.8 06/26/2013 2231   CL 107 06/26/2013 2231   CO2 27 06/26/2013 2231   BUN 14 06/26/2013 2231   CREATININE 0.98 06/26/2013 2231      Component Value Date/Time   CALCIUM 8.7 06/26/2013 2231      No results found for: TSH  No results found for: CHOL, HDL, LDLCALC, LDLDIRECT, TRIG, CHOLHDL  Assessment:   Annual gynecologic examination 56 y.o. Obesity Class II Postmenopausal vasomotor symptoms Breast cancer screening Family h/o colon cancer History of pulmonary embolism  Plan:  - Pap: Not needed.  Discussed that as patient has had a hysterectomy, she no longer requires pap smears.  - Mammogram: Ordered - Stool Guaiac Testing:  Not Ordered. Colonoscopy up to date. To repeat every 5 years due to family h/o colon  cancer - Labs: None today.  Patient has appointment with PCP this afternoon with labs. - Routine preventative health maintenance measures emphasized: Exercise/Diet/Weight control, Alcohol/Substance use risks and Stress Management - Patient with occasionally bothersome menopausal vasomotor symptoms. Discussed lifestyle interventions such as wearing light clothing, remaining in cool environments, having fan/air conditioner in the room, avoiding hot beverages etc.  Patient unable to use hormonal therapy due to history of thromboembolism.   Also discussed other medical options such as Paxil, Effexor or Neurontin.  Discussed OTC herbal remedies. Also referred her to [email or web address] for other alternative options. If patient desires prescription medication, can call office.  Return to Clinic - 1 Year   Hildred Laser, MD [facility]

## 2016-07-13 NOTE — Patient Instructions (Addendum)
[email or web address]   Health Maintenance for Postmenopausal Women Menopause is a normal process in which your reproductive ability comes to an end. This process happens gradually over a span of months to years, usually between the ages of 74 and 74. Menopause is complete when you have missed 12 consecutive menstrual periods. It is important to talk with your health care provider about some of the most common conditions that affect postmenopausal women, such as heart disease, cancer, and bone loss (osteoporosis). Adopting a healthy lifestyle and getting preventive care can help to promote your health and wellness. Those actions can also lower your chances of developing some of these common conditions. What should I know about menopause? During menopause, you may experience a number of symptoms, such as:  Moderate-to-severe hot flashes.  Night sweats.  Decrease in sex drive.  Mood swings.  Headaches.  Tiredness.  Irritability.  Memory problems.  Insomnia.  Choosing to treat or not to treat menopausal changes is an individual decision that you make with your health care provider. What should I know about hormone replacement therapy and supplements? Hormone therapy products are effective for treating symptoms that are associated with menopause, such as hot flashes and night sweats. Hormone replacement carries certain risks, especially as you become older. If you are thinking about using estrogen or estrogen with progestin treatments, discuss the benefits and risks with your health care provider. What should I know about heart disease and stroke? Heart disease, heart attack, and stroke become more likely as you age. This may be due, in part, to the hormonal changes that your body experiences during menopause. These can affect how your body processes dietary fats, triglycerides, and cholesterol. Heart attack and stroke are both medical emergencies. There are many things that you can do to  help prevent heart disease and stroke:  Have your blood pressure checked at least every 1-2 years. High blood pressure causes heart disease and increases the risk of stroke.  If you are 57-52 years old, ask your health care provider if you should take aspirin to prevent a heart attack or a stroke.  Do not use any tobacco products, including cigarettes, chewing tobacco, or electronic cigarettes. If you need help quitting, ask your health care provider.  It is important to eat a healthy diet and maintain a healthy weight. ? Be sure to include plenty of vegetables, fruits, low-fat dairy products, and lean protein. ? Avoid eating foods that are high in solid fats, added sugars, or salt (sodium).  Get regular exercise. This is one of the most important things that you can do for your health. ? Try to exercise for at least 150 minutes each week. The type of exercise that you do should increase your heart rate and make you sweat. This is known as moderate-intensity exercise. ? Try to do strengthening exercises at least twice each week. Do these in addition to the moderate-intensity exercise.  Know your numbers.Ask your health care provider to check your cholesterol and your blood glucose. Continue to have your blood tested as directed by your health care provider.  What should I know about cancer screening? There are several types of cancer. Take the following steps to reduce your risk and to catch any cancer development as early as possible. Breast Cancer  Practice breast self-awareness. ? This means understanding how your breasts normally appear and feel. ? It also means doing regular breast self-exams. Let your health care provider know about any changes, no matter how small.  If  you are 41 or older, have a clinician do a breast exam (clinical breast exam or CBE) every year. Depending on your age, family history, and medical history, it may be recommended that you also have a yearly breast  X-ray (mammogram).  If you have a family history of breast cancer, talk with your health care provider about genetic screening.  If you are at high risk for breast cancer, talk with your health care provider about having an MRI and a mammogram every year.  Breast cancer (BRCA) gene test is recommended for women who have family members with BRCA-related cancers. Results of the assessment will determine the need for genetic counseling and BRCA1 and for BRCA2 testing. BRCA-related cancers include these types: ? Breast. This occurs in males or females. ? Ovarian. ? Tubal. This may also be called fallopian tube cancer. ? Cancer of the abdominal or pelvic lining (peritoneal cancer). ? Prostate. ? Pancreatic.  Cervical, Uterine, and Ovarian Cancer Your health care provider may recommend that you be screened regularly for cancer of the pelvic organs. These include your ovaries, uterus, and vagina. This screening involves a pelvic exam, which includes checking for microscopic changes to the surface of your cervix (Pap test).  For women ages 21-65, health care providers may recommend a pelvic exam and a Pap test every three years. For women ages 46-65, they may recommend the Pap test and pelvic exam, combined with testing for human papilloma virus (HPV), every five years. Some types of HPV increase your risk of cervical cancer. Testing for HPV may also be done on women of any age who have unclear Pap test results.  Other health care providers may not recommend any screening for nonpregnant women who are considered low risk for pelvic cancer and have no symptoms. Ask your health care provider if a screening pelvic exam is right for you.  If you have had past treatment for cervical cancer or a condition that could lead to cancer, you need Pap tests and screening for cancer for at least 20 years after your treatment. If Pap tests have been discontinued for you, your risk factors (such as having a new sexual  partner) need to be reassessed to determine if you should start having screenings again. Some women have medical problems that increase the chance of getting cervical cancer. In these cases, your health care provider may recommend that you have screening and Pap tests more often.  If you have a family history of uterine cancer or ovarian cancer, talk with your health care provider about genetic screening.  If you have vaginal bleeding after reaching menopause, tell your health care provider.  There are currently no reliable tests available to screen for ovarian cancer.  Lung Cancer Lung cancer screening is recommended for adults 76-72 years old who are at high risk for lung cancer because of a history of smoking. A yearly low-dose CT scan of the lungs is recommended if you:  Currently smoke.  Have a history of at least 30 pack-years of smoking and you currently smoke or have quit within the past 15 years. A pack-year is smoking an average of one pack of cigarettes per day for one year.  Yearly screening should:  Continue until it has been 15 years since you quit.  Stop if you develop a health problem that would prevent you from having lung cancer treatment.  Colorectal Cancer  This type of cancer can be detected and can often be prevented.  Routine colorectal cancer screening  usually begins at age 77 and continues through age 62.  If you have risk factors for colon cancer, your health care provider may recommend that you be screened at an earlier age.  If you have a family history of colorectal cancer, talk with your health care provider about genetic screening.  Your health care provider may also recommend using home test kits to check for hidden blood in your stool.  A small camera at the end of a tube can be used to examine your colon directly (sigmoidoscopy or colonoscopy). This is done to check for the earliest forms of colorectal cancer.  Direct examination of the colon  should be repeated every 5-10 years until age 80. However, if early forms of precancerous polyps or small growths are found or if you have a family history or genetic risk for colorectal cancer, you may need to be screened more often.  Skin Cancer  Check your skin from head to toe regularly.  Monitor any moles. Be sure to tell your health care provider: ? About any new moles or changes in moles, especially if there is a change in a mole's shape or color. ? If you have a mole that is larger than the size of a pencil eraser.  If any of your family members has a history of skin cancer, especially at a young age, talk with your health care provider about genetic screening.  Always use sunscreen. Apply sunscreen liberally and repeatedly throughout the day.  Whenever you are outside, protect yourself by wearing long sleeves, pants, a wide-brimmed hat, and sunglasses.  What should I know about osteoporosis? Osteoporosis is a condition in which bone destruction happens more quickly than new bone creation. After menopause, you may be at an increased risk for osteoporosis. To help prevent osteoporosis or the bone fractures that can happen because of osteoporosis, the following is recommended:  If you are 76-25 years old, get at least 1,000 mg of calcium and at least 600 mg of vitamin D per day.  If you are older than age 5 but younger than age 77, get at least 1,200 mg of calcium and at least 600 mg of vitamin D per day.  If you are older than age 71, get at least 1,200 mg of calcium and at least 800 mg of vitamin D per day.  Smoking and excessive alcohol intake increase the risk of osteoporosis. Eat foods that are rich in calcium and vitamin D, and do weight-bearing exercises several times each week as directed by your health care provider. What should I know about how menopause affects my mental health? Depression may occur at any age, but it is more common as you become older. Common symptoms of  depression include:  Low or sad mood.  Changes in sleep patterns.  Changes in appetite or eating patterns.  Feeling an overall lack of motivation or enjoyment of activities that you previously enjoyed.  Frequent crying spells.  Talk with your health care provider if you think that you are experiencing depression. What should I know about immunizations? It is important that you get and maintain your immunizations. These include:  Tetanus, diphtheria, and pertussis (Tdap) booster vaccine.  Influenza every year before the flu season begins.  Pneumonia vaccine.  Shingles vaccine.  Your health care provider may also recommend other immunizations. This information is not intended to replace advice given to you by your health care provider. Make sure you discuss any questions you have with your health care provider.  Document Released: 03/18/2005 Document Revised: 08/14/2015 Document Reviewed: 10/28/2014 Elsevier Interactive Patient Education  2018 [employer].

## 2016-08-04 ENCOUNTER — Encounter: Payer: Managed Care, Other (non HMO) | Admitting: Obstetrics and Gynecology

## 2016-08-04 ENCOUNTER — Ambulatory Visit
Admission: RE | Admit: 2016-08-04 | Discharge: 2016-08-04 | Disposition: A | Discharge status: Home / Self Care | Payer: Managed Care, Other (non HMO) | Source: Ambulatory Visit | Attending: Obstetrics and Gynecology | Admitting: Obstetrics and Gynecology

## 2016-08-04 DIAGNOSIS — Z1231 Encounter for screening mammogram for malignant neoplasm of breast: Secondary | ICD-10-CM | POA: Diagnosis not present

## 2016-08-04 DIAGNOSIS — R928 Other abnormal and inconclusive findings on diagnostic imaging of breast: Secondary | ICD-10-CM | POA: Diagnosis not present

## 2016-08-08 ENCOUNTER — Other Ambulatory Visit: Payer: Self-pay | Admitting: Obstetrics and Gynecology

## 2016-08-08 DIAGNOSIS — R928 Other abnormal and inconclusive findings on diagnostic imaging of breast: Secondary | ICD-10-CM

## 2016-08-08 DIAGNOSIS — N6320 Unspecified lump in the left breast, unspecified quadrant: Secondary | ICD-10-CM

## 2016-08-15 ENCOUNTER — Ambulatory Visit
Admission: RE | Admit: 2016-08-15 | Discharge: 2016-08-15 | Disposition: A | Discharge status: Home / Self Care | Payer: Managed Care, Other (non HMO) | Source: Ambulatory Visit | Attending: Obstetrics and Gynecology | Admitting: Obstetrics and Gynecology

## 2016-08-15 DIAGNOSIS — R928 Other abnormal and inconclusive findings on diagnostic imaging of breast: Secondary | ICD-10-CM | POA: Diagnosis present

## 2016-08-15 DIAGNOSIS — N6320 Unspecified lump in the left breast, unspecified quadrant: Secondary | ICD-10-CM | POA: Diagnosis present

## 2017-04-27 ENCOUNTER — Encounter: Payer: Self-pay | Admitting: Obstetrics and Gynecology

## 2017-04-27 ENCOUNTER — Ambulatory Visit (INDEPENDENT_AMBULATORY_CARE_PROVIDER_SITE_OTHER): Payer: Managed Care, Other (non HMO) | Admitting: Obstetrics and Gynecology

## 2017-04-27 VITALS — BP 126/84 | HR 77 | Ht 65.0 in | Wt 233.1 lb

## 2017-04-27 DIAGNOSIS — N898 Other specified noninflammatory disorders of vagina: Secondary | ICD-10-CM | POA: Diagnosis not present

## 2017-04-27 NOTE — Progress Notes (Signed)
    GYNECOLOGY PROGRESS NOTE  Subjective:    Patient ID: Sydney Soto, female    DOB: 1960-09-30, 57 y.o.   MRN: [id number]  HPI  Patient is a 57 y.o. 652P2002 female who presents for complaints of possible vaginal cyst, noticed it yesterday when bathing. Denies any major discomfort but notes it is occasionally mildly tender to touch. Denies fevers, chills, drainage. Denies any abnormal vaginal discharge.  Notes that she was concerned as her husband was recently diagnosed with prostate cancer, and so she is paranoid about any new growths or findings on her own body.     The following portions of the patient's history were reviewed and updated as appropriate: allergies, current medications, past family history, past medical history, past social history, past surgical history and problem list.  Review of Systems Pertinent items noted in HPI and remainder of comprehensive ROS otherwise negative.   Objective:   Blood pressure 126/84, pulse 77, height 5\' 5"  (1.651 m), weight 233 lb 1.6 oz (105.7 kg). General appearance: alert and no distress Pelvic: external genitalia normal.  Base of left wall of vagina with 2 x 1 cm firm cystic nodule just beyond introitus beneath mucosa.I&D performed with quarter sized blood clot expressed (see procedure note below).  Speculum and bimanual exam not performed.     Assessment:   Vaginal hematoma  Plan:   - I&D performed of suspected inclusion cyst, however was actually noted to be blood containing with quarter sized clot.  Discussed diagnosis of hematoma. Patient denied any recent vaginal trauma, but does note that she sat for hours over the weekend in her garage on an uncomfortable chair as she was working in her garage. Wound care instructions given.  - F/u as needed.    Procedure Details  The procedure, risks and complications have been discussed in detail (including infection, bleeding) with the patient, and the patient has given verbal  consent to the procedure.  The skin was prepped with betadine.  After adequate local anesthesia wiwth 2 cc of lidocaine with epinephrine, they cyst was attempted to be aspirated without success. After this, an I&D with a #11 blade was performed on the vaginal wall cyst. Scant bloody drainage and a quarter-sized blood clot was expressed. The patient tolerated the procedure well. She was advised on Sitz baths/warm compresses for the next several days.  Can use tylenol or motrin for pain as needed.    Hildred Laserherry, Akshar Starnes, MD [facility]

## 2017-04-27 NOTE — Progress Notes (Signed)
Pt noticed cyst inside of vagina yesterday. Pt has no pain from cyst.

## 2017-04-27 NOTE — Patient Instructions (Signed)
Hematoma A hematoma is a collection of blood. The collection of blood can turn into a hard, painful lump under the skin. Your skin may turn blue or yellow if the hematoma is close to the surface of the skin. Most hematomas get better in a few days to weeks. Some hematomas are serious and need medical care. Hematomas can be very small or very big. Follow these instructions at home:  Apply ice to the injured area: ? Put ice in a plastic bag. ? Place a towel between your skin and the bag. ? Leave the ice on for 20 minutes, 2-3 times a day for the first 1 to 2 days.  After the first 2 days, switch to using warm packs on the injured area.  Raise (elevate) the injured area to lessen pain and puffiness (swelling). You may also wrap the area with an elastic bandage. Make sure the bandage is not wrapped too tight.  If you have a painful hematoma on your leg or foot, you may use crutches for a couple days.  Only take medicines as told by your doctor. Get help right away if:  Your pain gets worse.  Your pain is not controlled with medicine.  You have a fever.  Your puffiness gets worse.  Your skin turns more blue or yellow.  Your skin over the hematoma breaks or starts bleeding.  Your hematoma is in your chest or belly (abdomen) and you are short of breath, feel weak, or have a change in consciousness.  Your hematoma is on your scalp and you have a headache that gets worse or a change in alertness or consciousness. This information is not intended to replace advice given to you by your health care provider. Make sure you discuss any questions you have with your health care provider. Document Released: 03/03/2004 Document Revised: 07/02/2015 Document Reviewed: 07/04/2012 Elsevier Interactive Patient Education  2017 [employer]  

## 2017-05-02 ENCOUNTER — Ambulatory Visit
Admission: RE | Admit: 2017-05-02 | Discharge: 2017-05-02 | Disposition: A | Discharge status: Home / Self Care | Payer: Managed Care, Other (non HMO) | Source: Ambulatory Visit | Attending: Physician Assistant | Admitting: Physician Assistant

## 2017-05-02 ENCOUNTER — Other Ambulatory Visit: Payer: Self-pay | Admitting: Physician Assistant

## 2017-05-02 ENCOUNTER — Ambulatory Visit: Payer: Managed Care, Other (non HMO) | Admitting: Obstetrics and Gynecology

## 2017-05-02 DIAGNOSIS — M79605 Pain in left leg: Secondary | ICD-10-CM | POA: Insufficient documentation

## 2017-05-02 DIAGNOSIS — L539 Erythematous condition, unspecified: Secondary | ICD-10-CM

## 2017-07-05 ENCOUNTER — Other Ambulatory Visit: Payer: Self-pay | Admitting: Obstetrics and Gynecology

## 2017-07-05 DIAGNOSIS — Z1231 Encounter for screening mammogram for malignant neoplasm of breast: Secondary | ICD-10-CM

## 2017-07-20 ENCOUNTER — Encounter: Payer: Managed Care, Other (non HMO) | Admitting: Obstetrics and Gynecology

## 2017-08-08 ENCOUNTER — Ambulatory Visit
Admission: RE | Admit: 2017-08-08 | Discharge: 2017-08-08 | Disposition: A | Discharge status: Home / Self Care | Payer: Managed Care, Other (non HMO) | Source: Ambulatory Visit | Attending: Obstetrics and Gynecology | Admitting: Obstetrics and Gynecology

## 2017-08-08 ENCOUNTER — Encounter: Payer: Self-pay | Admitting: Obstetrics and Gynecology

## 2017-08-08 ENCOUNTER — Ambulatory Visit (INDEPENDENT_AMBULATORY_CARE_PROVIDER_SITE_OTHER): Payer: Managed Care, Other (non HMO) | Admitting: Obstetrics and Gynecology

## 2017-08-08 VITALS — BP 140/91 | HR 86 | Ht 65.0 in | Wt 221.5 lb

## 2017-08-08 DIAGNOSIS — Z01419 Encounter for gynecological examination (general) (routine) without abnormal findings: Secondary | ICD-10-CM | POA: Diagnosis not present

## 2017-08-08 DIAGNOSIS — Z86711 Personal history of pulmonary embolism: Secondary | ICD-10-CM | POA: Diagnosis not present

## 2017-08-08 DIAGNOSIS — Z1231 Encounter for screening mammogram for malignant neoplasm of breast: Secondary | ICD-10-CM

## 2017-08-08 DIAGNOSIS — Z800 Family history of malignant neoplasm of digestive organs: Secondary | ICD-10-CM | POA: Diagnosis not present

## 2017-08-08 DIAGNOSIS — E669 Obesity, unspecified: Secondary | ICD-10-CM

## 2017-08-08 NOTE — Progress Notes (Signed)
ANNUAL PREVENTATIVE CARE GYNECOLOGY  ENCOUNTER NOTE  Subjective:       Sydney Soto is a 57 y.o. 1012P2002 female here for a routine annual gynecologic exam. The patient is sexually active. The patient is not taking hormone replacement therapy. Patient denies post-menopausal vaginal bleeding. The patient wears seatbelts: yes. The patient participates in regular exercise: no. Has the patient ever been transfused or tattooed?: no. The patient reports that there is not domestic violence in her life.  Current complaints: 1.  None   Gynecologic History No LMP recorded. Patient has had a hysterectomy. Contraception: status post hysterectomy Last Pap: 2017. Results were: normal Last mammogram: 08/15/2016. Results were: normal.  Scheduled for mammogram today.  Last Colonoscopy: 4 years ago.  Gets screened q 5 years due to family history of colon cancer.  Last Dexa Scan: None   Obstetric History OB History  Gravida Para Term Preterm AB Living  2 2 2     2   SAB TAB Ectopic Multiple Live Births          2    # Outcome Date GA Lbr Len/2nd Weight Sex Delivery Anes PTL Lv  2 Term 1997    F Vag-Spont   LIV  1 Term [id number]    F Vag-Spont   LIV    Past Medical History:  Diagnosis Date  . Anxiety   . Pulmonary embolism (HCC)    Bilateral     Family History  Problem Relation Age of Onset  . Osteoarthritis Mother   . Colon cancer Mother   . Osteoarthritis Father   . Diabetes Father   . Thyroid disease Sister   . Diabetes Brother   . Breast cancer Neg Hx   . Ovarian cancer Neg Hx     Past Surgical History:  Procedure Laterality Date  . COLONOSCOPY N/A 07/02/2014   Procedure: COLONOSCOPY;  Surgeon: Scot Junobert T Elliott, MD;  [facility];  Service: Endoscopy;  Laterality: N/A;  . ESOPHAGOGASTRODUODENOSCOPY N/A 07/02/2014   Procedure: ESOPHAGOGASTRODUODENOSCOPY (EGD);  Surgeon: Scot Junobert T Elliott, MD;  [facility];  Service: Endoscopy;  Laterality: N/A;     Social History   Socioeconomic History  . Marital status: Married    Spouse name: Not on file  . Number of children: Not on file  . Years of education: Not on file  . Highest education level: Not on file  Occupational History  . Not on file  Social Needs  . Financial resource strain: Not on file  . Food insecurity:    Worry: Not on file    Inability: Not on file  . Transportation needs:    Medical: Not on file    Non-medical: Not on file  Tobacco Use  . Smoking status: Never Smoker  . Smokeless tobacco: Never Used  Substance and Sexual Activity  . Alcohol use: No  . Drug use: No  . Sexual activity: Yes    Birth control/protection: Surgical  Lifestyle  . Physical activity:    Days per week: Not on file    Minutes per session: Not on file  . Stress: Not on file  Relationships  . Social connections:    Talks on phone: Not on file    Gets together: Not on file    Attends religious service: Not on file    Active member of club or organization: Not on file    Attends meetings of clubs or organizations: Not on file    Relationship status: Not  on file  . Intimate partner violence:    Fear of current or ex partner: Not on file    Emotionally abused: Not on file    Physically abused: Not on file    Forced sexual activity: Not on file  Other Topics Concern  . Not on file  Social History Narrative  . Not on file    Current Outpatient Medications on File Prior to Visit  Medication Sig Dispense Refill  . aspirin 325 MG tablet Take 325 mg by mouth daily.    Marland Kitchen buPROPion (WELLBUTRIN SR) 150 MG 12 hr tablet TK 1 T PO 2 TIMES D  3  . Cholecalciferol (VITAMIN D3) 1000 units CAPS Take by mouth.    . cyanocobalamin (,VITAMIN B-12,) 1000 MCG/ML injection Inject 1 mL into the muscle every 30 (thirty) days.    . DULoxetine (CYMBALTA) 30 MG capsule Take 30-60 mg by mouth 2 (two) times daily. 2 cap in the morning and 1 cap at night    . magnesium oxide (MAG-OX) 400 MG tablet Take  400 mg by mouth daily.     No current facility-administered medications on file prior to visit.     Allergies  Allergen Reactions  . Erythromycin Nausea Only    Review of Systems ROS Review of Systems - General ROS: negative for - chills, fatigue, fever, hot flashes, night sweats, weight gain or weight loss Psychological ROS: negative for - anxiety, decreased libido, depression, mood swings, physical abuse or sexual abuse Ophthalmic ROS: negative for - blurry vision, eye pain or loss of vision ENT ROS: negative for - headaches, hearing change, visual changes or vocal changes Allergy and Immunology ROS: negative for - hives, itchy/watery eyes or seasonal allergies Hematological and Lymphatic ROS: negative for - bleeding problems, bruising, swollen lymph nodes or weight loss Endocrine ROS: negative for - galactorrhea, hair pattern changes, hot flashes, malaise/lethargy, mood swings, palpitations, polydipsia/polyuria, skin changes, temperature intolerance or unexpected weight changes Breast ROS: negative for - new or changing breast lumps or nipple discharge Respiratory ROS: negative for - cough or shortness of breath Cardiovascular ROS: negative for - chest pain, irregular heartbeat, palpitations or shortness of breath Gastrointestinal ROS: no abdominal pain, change in bowel habits, or black or bloody stools Genito-Urinary ROS: no dysuria, trouble voiding, or hematuria.  Positive for vasomotor symptoms (mild, occasional) Musculoskeletal ROS: negative for - joint pain or joint stiffness Neurological ROS: negative for - bowel and bladder control changes Dermatological ROS: negative for rash and skin changes.     Objective:   BP (!) 140/91   Pulse 86   Ht 5\' 5"  (1.651 m)   Wt 221 lb 8 oz (100.5 kg)   BMI 36.86 kg/m  CONSTITUTIONAL: Well-developed, well-nourished female in no acute distress. Moderate obesity PSYCHIATRIC: Normal mood and affect. Normal behavior. Normal judgment and  thought content. NEUROLGIC: Alert and oriented to person, place, and time. Normal muscle tone coordination. No cranial nerve deficit noted. HENT:  Normocephalic, atraumatic, External right and left ear normal. Oropharynx is clear and moist EYES: Conjunctivae and EOM are normal. Pupils are equal, round, and reactive to light. No scleral icterus.  NECK: Normal range of motion, supple, no masses.  Normal thyroid.  SKIN: Skin is warm and dry. No rash noted. Not diaphoretic. No erythema. No pallor. CARDIOVASCULAR: Normal heart rate noted, regular rhythm, no murmur.   RESPIRATORY: Clear to auscultation bilaterally. Effort and breath sounds normal, no problems with respiration noted. BREASTS: Symmetric in size. No masses, skin  changes, nipple drainage, or lymphadenopathy. ABDOMEN: Soft, normal bowel sounds, no distention noted.  No tenderness, rebound or guarding.  BLADDER: Normal PELVIC:  Bladder no bladder distension noted  Urethra: normal appearing urethra with no masses, tenderness or lesions  Vulva: normal without lesions  Vagina: normal appearing vagina with normal color and discharge, no lesions  Cervix: surgically absent  Uterus: surgically absent, vaginal cuff well healed  Adnexa: normal adnexa in size, nontender and no masses  RV: External Exam NormaI, No Rectal Masses and Normal Sphincter tone  MUSCULOSKELETAL: Normal range of motion. No tenderness.  No cyanosis, clubbing, or edema.  2+ distal pulses. LYMPHATIC: No Axillary, Supraclavicular, or Inguinal Adenopathy.   Labs:   Labs reviewed in Care Everywhere  Assessment:   Annual gynecologic examination 57 y.o. Obesity Class II Breast cancer screening Family h/o colon cancer History of pulmonary embolism  Plan:  - Pap: Not needed.  Discussed that as patient has had a hysterectomy, she no longer requires pap smears.  - Mammogram: Ordered - Stool Guaiac Testing:  Not Ordered. Colonoscopy up to date. To repeat every 5 years due  to family h/o colon cancer - Labs: None.  Recent labs reviewed in Care Everywhere, performed by PCP.  - Routine preventative health maintenance measures emphasized: Exercise/Diet/Weight control, Alcohol/Substance use risks and Stress Management - Patient with h/o PE.  To avoid estrogen containing products if remedies ever desired for vasomotor symptoms. To also discuss OTC herbal remedies with provider before initiation.  Return to Clinic - 1 Year   Hildred Laser, MD [facility]

## 2017-08-08 NOTE — Patient Instructions (Signed)
Health Maintenance for Postmenopausal Women Menopause is a normal process in which your reproductive ability comes to an end. This process happens gradually over a span of months to years, usually between the ages of 22 and 9. Menopause is complete when you have missed 12 consecutive menstrual periods. It is important to talk with your health care provider about some of the most common conditions that affect postmenopausal women, such as heart disease, cancer, and bone loss (osteoporosis). Adopting a healthy lifestyle and getting preventive care can help to promote your health and wellness. Those actions can also lower your chances of developing some of these common conditions. What should I know about menopause? During menopause, you may experience a number of symptoms, such as:  Moderate-to-severe hot flashes.  Night sweats.  Decrease in sex drive.  Mood swings.  Headaches.  Tiredness.  Irritability.  Memory problems.  Insomnia.  Choosing to treat or not to treat menopausal changes is an individual decision that you make with your health care provider. What should I know about hormone replacement therapy and supplements? Hormone therapy products are effective for treating symptoms that are associated with menopause, such as hot flashes and night sweats. Hormone replacement carries certain risks, especially as you become older. If you are thinking about using estrogen or estrogen with progestin treatments, discuss the benefits and risks with your health care provider. What should I know about heart disease and stroke? Heart disease, heart attack, and stroke become more likely as you age. This may be due, in part, to the hormonal changes that your body experiences during menopause. These can affect how your body processes dietary fats, triglycerides, and cholesterol. Heart attack and stroke are both medical emergencies. There are many things that you can do to help prevent heart disease  and stroke:  Have your blood pressure checked at least every 1-2 years. High blood pressure causes heart disease and increases the risk of stroke.  If you are 53-22 years old, ask your health care provider if you should take aspirin to prevent a heart attack or a stroke.  Do not use any tobacco products, including cigarettes, chewing tobacco, or electronic cigarettes. If you need help quitting, ask your health care provider.  It is important to eat a healthy diet and maintain a healthy weight. ? Be sure to include plenty of vegetables, fruits, low-fat dairy products, and lean protein. ? Avoid eating foods that are high in solid fats, added sugars, or salt (sodium).  Get regular exercise. This is one of the most important things that you can do for your health. ? Try to exercise for at least 150 minutes each week. The type of exercise that you do should increase your heart rate and make you sweat. This is known as moderate-intensity exercise. ? Try to do strengthening exercises at least twice each week. Do these in addition to the moderate-intensity exercise.  Know your numbers.Ask your health care provider to check your cholesterol and your blood glucose. Continue to have your blood tested as directed by your health care provider.  What should I know about cancer screening? There are several types of cancer. Take the following steps to reduce your risk and to catch any cancer development as early as possible. Breast Cancer  Practice breast self-awareness. ? This means understanding how your breasts normally appear and feel. ? It also means doing regular breast self-exams. Let your health care provider know about any changes, no matter how small.  If you are 40  or older, have a clinician do a breast exam (clinical breast exam or CBE) every year. Depending on your age, family history, and medical history, it may be recommended that you also have a yearly breast X-ray (mammogram).  If you  have a family history of breast cancer, talk with your health care provider about genetic screening.  If you are at high risk for breast cancer, talk with your health care provider about having an MRI and a mammogram every year.  Breast cancer (BRCA) gene test is recommended for women who have family members with BRCA-related cancers. Results of the assessment will determine the need for genetic counseling and BRCA1 and for BRCA2 testing. BRCA-related cancers include these types: ? Breast. This occurs in males or females. ? Ovarian. ? Tubal. This may also be called fallopian tube cancer. ? Cancer of the abdominal or pelvic lining (peritoneal cancer). ? Prostate. ? Pancreatic.  Cervical, Uterine, and Ovarian Cancer Your health care provider may recommend that you be screened regularly for cancer of the pelvic organs. These include your ovaries, uterus, and vagina. This screening involves a pelvic exam, which includes checking for microscopic changes to the surface of your cervix (Pap test).  For women ages 21-65, health care providers may recommend a pelvic exam and a Pap test every three years. For women ages 79-65, they may recommend the Pap test and pelvic exam, combined with testing for human papilloma virus (HPV), every five years. Some types of HPV increase your risk of cervical cancer. Testing for HPV may also be done on women of any age who have unclear Pap test results.  Other health care providers may not recommend any screening for nonpregnant women who are considered low risk for pelvic cancer and have no symptoms. Ask your health care provider if a screening pelvic exam is right for you.  If you have had past treatment for cervical cancer or a condition that could lead to cancer, you need Pap tests and screening for cancer for at least 20 years after your treatment. If Pap tests have been discontinued for you, your risk factors (such as having a new sexual partner) need to be  reassessed to determine if you should start having screenings again. Some women have medical problems that increase the chance of getting cervical cancer. In these cases, your health care provider may recommend that you have screening and Pap tests more often.  If you have a family history of uterine cancer or ovarian cancer, talk with your health care provider about genetic screening.  If you have vaginal bleeding after reaching menopause, tell your health care provider.  There are currently no reliable tests available to screen for ovarian cancer.  Lung Cancer Lung cancer screening is recommended for adults 69-62 years old who are at high risk for lung cancer because of a history of smoking. A yearly low-dose CT scan of the lungs is recommended if you:  Currently smoke.  Have a history of at least 30 pack-years of smoking and you currently smoke or have quit within the past 15 years. A pack-year is smoking an average of one pack of cigarettes per day for one year.  Yearly screening should:  Continue until it has been 15 years since you quit.  Stop if you develop a health problem that would prevent you from having lung cancer treatment.  Colorectal Cancer  This type of cancer can be detected and can often be prevented.  Routine colorectal cancer screening usually begins at  age 42 and continues through age 45.  If you have risk factors for colon cancer, your health care provider may recommend that you be screened at an earlier age.  If you have a family history of colorectal cancer, talk with your health care provider about genetic screening.  Your health care provider may also recommend using home test kits to check for hidden blood in your stool.  A small camera at the end of a tube can be used to examine your colon directly (sigmoidoscopy or colonoscopy). This is done to check for the earliest forms of colorectal cancer.  Direct examination of the colon should be repeated every  5-10 years until age 71. However, if early forms of precancerous polyps or small growths are found or if you have a family history or genetic risk for colorectal cancer, you may need to be screened more often.  Skin Cancer  Check your skin from head to toe regularly.  Monitor any moles. Be sure to tell your health care provider: ? About any new moles or changes in moles, especially if there is a change in a mole's shape or color. ? If you have a mole that is larger than the size of a pencil eraser.  If any of your family members has a history of skin cancer, especially at a young age, talk with your health care provider about genetic screening.  Always use sunscreen. Apply sunscreen liberally and repeatedly throughout the day.  Whenever you are outside, protect yourself by wearing long sleeves, pants, a wide-brimmed hat, and sunglasses.  What should I know about osteoporosis? Osteoporosis is a condition in which bone destruction happens more quickly than new bone creation. After menopause, you may be at an increased risk for osteoporosis. To help prevent osteoporosis or the bone fractures that can happen because of osteoporosis, the following is recommended:  If you are 46-71 years old, get at least 1,000 mg of calcium and at least 600 mg of vitamin D per day.  If you are older than age 55 but younger than age 65, get at least 1,200 mg of calcium and at least 600 mg of vitamin D per day.  If you are older than age 54, get at least 1,200 mg of calcium and at least 800 mg of vitamin D per day.  Smoking and excessive alcohol intake increase the risk of osteoporosis. Eat foods that are rich in calcium and vitamin D, and do weight-bearing exercises several times each week as directed by your health care provider. What should I know about how menopause affects my mental health? Depression may occur at any age, but it is more common as you become older. Common symptoms of depression  include:  Low or sad mood.  Changes in sleep patterns.  Changes in appetite or eating patterns.  Feeling an overall lack of motivation or enjoyment of activities that you previously enjoyed.  Frequent crying spells.  Talk with your health care provider if you think that you are experiencing depression. What should I know about immunizations? It is important that you get and maintain your immunizations. These include:  Tetanus, diphtheria, and pertussis (Tdap) booster vaccine.  Influenza every year before the flu season begins.  Pneumonia vaccine.  Shingles vaccine.  Your health care provider may also recommend other immunizations. This information is not intended to replace advice given to you by your health care provider. Make sure you discuss any questions you have with your health care provider. Document Released: 03/18/2005  Document Revised: 08/14/2015 Document Reviewed: 10/28/2014 Elsevier Interactive Patient Education  2018 [employer]  

## 2017-08-08 NOTE — Progress Notes (Signed)
Pt is present today for her annual exam. Pt stated that she is doing well no complaints.  

## 2018-03-02 DIAGNOSIS — E1165 Type 2 diabetes mellitus with hyperglycemia: Secondary | ICD-10-CM | POA: Insufficient documentation

## 2018-03-02 DIAGNOSIS — E039 Hypothyroidism, unspecified: Secondary | ICD-10-CM | POA: Insufficient documentation

## 2018-06-01 DIAGNOSIS — E7800 Pure hypercholesterolemia, unspecified: Secondary | ICD-10-CM | POA: Insufficient documentation

## 2018-07-05 ENCOUNTER — Other Ambulatory Visit: Payer: Self-pay | Admitting: Obstetrics and Gynecology

## 2018-07-05 DIAGNOSIS — Z1231 Encounter for screening mammogram for malignant neoplasm of breast: Secondary | ICD-10-CM

## 2018-08-13 ENCOUNTER — Ambulatory Visit
Admission: RE | Admit: 2018-08-13 | Discharge: 2018-08-13 | Disposition: A | Discharge status: Home / Self Care | Payer: Managed Care, Other (non HMO) | Source: Ambulatory Visit | Attending: Obstetrics and Gynecology | Admitting: Obstetrics and Gynecology

## 2018-08-13 ENCOUNTER — Other Ambulatory Visit: Payer: Self-pay

## 2018-08-13 DIAGNOSIS — Z1231 Encounter for screening mammogram for malignant neoplasm of breast: Secondary | ICD-10-CM | POA: Diagnosis present

## 2018-08-16 ENCOUNTER — Encounter: Payer: Managed Care, Other (non HMO) | Admitting: Obstetrics and Gynecology

## 2018-10-26 ENCOUNTER — Other Ambulatory Visit: Payer: Self-pay

## 2018-10-26 ENCOUNTER — Ambulatory Visit (INDEPENDENT_AMBULATORY_CARE_PROVIDER_SITE_OTHER): Payer: Managed Care, Other (non HMO) | Admitting: Obstetrics and Gynecology

## 2018-10-26 ENCOUNTER — Encounter: Payer: Self-pay | Admitting: Obstetrics and Gynecology

## 2018-10-26 ENCOUNTER — Other Ambulatory Visit (HOSPITAL_COMMUNITY)
Admission: RE | Admit: 2018-10-26 | Discharge: 2018-10-26 | Disposition: A | Discharge status: Home / Self Care | Payer: Managed Care, Other (non HMO) | Source: Ambulatory Visit | Attending: Obstetrics and Gynecology | Admitting: Obstetrics and Gynecology

## 2018-10-26 VITALS — BP 119/80 | HR 71 | Ht 65.0 in | Wt 199.6 lb

## 2018-10-26 DIAGNOSIS — Z1231 Encounter for screening mammogram for malignant neoplasm of breast: Secondary | ICD-10-CM

## 2018-10-26 DIAGNOSIS — Z01419 Encounter for gynecological examination (general) (routine) without abnormal findings: Secondary | ICD-10-CM | POA: Diagnosis not present

## 2018-10-26 DIAGNOSIS — Z86711 Personal history of pulmonary embolism: Secondary | ICD-10-CM

## 2018-10-26 DIAGNOSIS — Z800 Family history of malignant neoplasm of digestive organs: Secondary | ICD-10-CM | POA: Diagnosis not present

## 2018-10-26 DIAGNOSIS — E669 Obesity, unspecified: Secondary | ICD-10-CM

## 2018-10-26 NOTE — Progress Notes (Signed)
ANNUAL PREVENTATIVE CARE GYNECOLOGY  ENCOUNTER NOTE  Subjective:       Sydney Soto is a 58 y.o. G52P2002 female here for a routine annual gynecologic exam. The patient is sexually active. The patient is not taking hormone replacement therapy. Patient denies post-menopausal vaginal bleeding. The patient wears seatbelts: yes. The patient participates in regular exercise: no. Has the patient ever been transfused or tattooed?: no. The patient reports that there is not domestic violence in her life.  Current complaints: 1.  None   Gynecologic History No LMP recorded. Patient has had a hysterectomy. Contraception: status post hysterectomy Last Pap: 2017. Results were: normal.  Last mammogram: 08/13/2018. Results were: normal.  Scheduled for mammogram today.  Last Colonoscopy: 4 years ago.  Gets screened q 5 years due to family history of colon cancer.  Last Dexa Scan: None   Obstetric History OB History  Gravida Para Term Preterm AB Living  2 2 2     2   SAB TAB Ectopic Multiple Live Births          2    # Outcome Date GA Lbr Len/2nd Weight Sex Delivery Anes PTL Lv  2 Term 1997    F Vag-Spont   LIV  1 Term 73    F Vag-Spont   LIV    Past Medical History:  Diagnosis Date  . Anxiety   . Pulmonary embolism (HCC)    Bilateral   . Thyroid disease     Family History  Problem Relation Age of Onset  . Osteoarthritis Mother   . Colon cancer Mother   . Osteoarthritis Father   . Diabetes Father   . Thyroid disease Sister   . Diabetes Brother   . Breast cancer Neg Hx   . Ovarian cancer Neg Hx     Past Surgical History:  Procedure Laterality Date  . COLONOSCOPY N/A 07/02/2014   Procedure: COLONOSCOPY;  Surgeon: Scot Jun, MD;  Location: [facility] ENDOSCOPY;  Service: Endoscopy;  Laterality: N/A;  . ESOPHAGOGASTRODUODENOSCOPY N/A 07/02/2014   Procedure: ESOPHAGOGASTRODUODENOSCOPY (EGD);  Surgeon: Scot Jun, MD;  Location: [facility] ENDOSCOPY;  Service: Endoscopy;   Laterality: N/A;    Social History   Socioeconomic History  . Marital status: Married    Spouse name: Not on file  . Number of children: Not on file  . Years of education: Not on file  . Highest education level: Not on file  Occupational History  . Not on file  Social Needs  . Financial resource strain: Not on file  . Food insecurity    Worry: Not on file    Inability: Not on file  . Transportation needs    Medical: Not on file    Non-medical: Not on file  Tobacco Use  . Smoking status: Never Smoker  . Smokeless tobacco: Never Used  Substance and Sexual Activity  . Alcohol use: No  . Drug use: No  . Sexual activity: Yes    Birth control/protection: Surgical  Lifestyle  . Physical activity    Days per week: Not on file    Minutes per session: Not on file  . Stress: Not on file  Relationships  . Social Musician on phone: Not on file    Gets together: Not on file    Attends religious service: Not on file    Active member of club or organization: Not on file    Attends meetings of clubs or organizations: Not on file  Relationship status: Not on file  . Intimate partner violence    Fear of current or ex partner: Not on file    Emotionally abused: Not on file    Physically abused: Not on file    Forced sexual activity: Not on file  Other Topics Concern  . Not on file  Social History Narrative  . Not on file    Current Outpatient Medications on File Prior to Visit  Medication Sig Dispense Refill  . aspirin 325 MG tablet Take 325 mg by mouth daily.    Marland Kitchen. buPROPion (WELLBUTRIN SR) 150 MG 12 hr tablet TK 1 T PO 2 TIMES D  3  . cyanocobalamin (,VITAMIN B-12,) 1000 MCG/ML injection Inject 1 mL into the muscle every 30 (thirty) days.    . DULoxetine (CYMBALTA) 30 MG capsule Take 30-60 mg by mouth 2 (two) times daily. 2 cap in the morning and 1 cap at night    . magnesium oxide (MAG-OX) 400 MG tablet Take 400 mg by mouth daily.     No current  facility-administered medications on file prior to visit.     Allergies  Allergen Reactions  . Erythromycin Nausea Only    Review of Systems ROS Review of Systems - General ROS: negative for - chills, fatigue, fever, hot flashes, night sweats, weight gain.  Positive for weight loss (intentional, ~  20 lbs since last visit).  Psychological ROS: negative for - anxiety, decreased libido, depression, mood swings, physical abuse or sexual abuse Ophthalmic ROS: negative for - blurry vision, eye pain or loss of vision ENT ROS: negative for - headaches, hearing change, visual changes or vocal changes Allergy and Immunology ROS: negative for - hives, itchy/watery eyes or seasonal allergies Hematological and Lymphatic ROS: negative for - bleeding problems, bruising, swollen lymph nodes or weight loss Endocrine ROS: negative for - galactorrhea, hair pattern changes, hot flashes, malaise/lethargy, mood swings, palpitations, polydipsia/polyuria, skin changes, temperature intolerance or unexpected weight changes Breast ROS: negative for - new or changing breast lumps or nipple discharge Respiratory ROS: negative for - cough or shortness of breath Cardiovascular ROS: negative for - chest pain, irregular heartbeat, palpitations or shortness of breath Gastrointestinal ROS: no abdominal pain, change in bowel habits, or black or bloody stools Genito-Urinary ROS: no dysuria, trouble voiding, or hematuria.   Musculoskeletal ROS: negative for - joint pain or joint stiffness Neurological ROS: negative for - bowel and bladder control changes Dermatological ROS: negative for rash and skin changes.     Objective:   BP 119/80   Pulse 71   Ht 5\' 5"  (1.651 m)   Wt 199 lb 9.6 oz (90.5 kg)   BMI 33.22 kg/m  CONSTITUTIONAL: Well-developed, well-nourished female in no acute distress. Mild obesity PSYCHIATRIC: Normal mood and affect. Normal behavior. Normal judgment and thought content. NEUROLGIC: Alert and  oriented to person, place, and time. Normal muscle tone coordination. No cranial nerve deficit noted. HENT:  Normocephalic, atraumatic, External right and left ear normal. Oropharynx is clear and moist EYES: Conjunctivae and EOM are normal. Pupils are equal, round, and reactive to light. No scleral icterus.  NECK: Normal range of motion, supple, no masses.  Normal thyroid.  SKIN: Skin is warm and dry. No rash noted. Not diaphoretic. No erythema. No pallor. CARDIOVASCULAR: Normal heart rate noted, regular rhythm, no murmur.   RESPIRATORY: Clear to auscultation bilaterally. Effort and breath sounds normal, no problems with respiration noted. BREASTS: Symmetric in size. No masses, skin changes, nipple drainage, or lymphadenopathy.  ABDOMEN: Soft, normal bowel sounds, no distention noted.  No tenderness, rebound or guarding.  BLADDER: Normal PELVIC:  Bladder no bladder distension noted  Urethra: normal appearing urethra with no masses, tenderness or lesions  Vulva: normal without lesions  Vagina: normal appearing vagina with normal color and discharge, no lesions  Cervix: surgically absent  Uterus: surgically absent, vaginal cuff well healed  Adnexa: normal adnexa in size, nontender and no masses  RV: External Exam NormaI, No Rectal Masses and Normal Sphincter tone  MUSCULOSKELETAL: Normal range of motion. No tenderness.  No cyanosis, clubbing, or edema.  2+ distal pulses. LYMPHATIC: No Axillary, Supraclavicular, or Inguinal Adenopathy.   Labs:   Labs reviewed in Care Everywhere  Assessment:   Annual gynecologic examination 58 y.o. Obesity Class 1I Breast cancer screening Family h/o colon cancer History of pulmonary embolism  Plan:  - Pap: Not needed.  Discussed that as patient has had a hysterectomy, she no longer requires pap smears. Patient still desires to have a pap smear today as she notes her previous GYN used to perform them regularly. Offered 1 pap smear today, no longer  necessary after this time.  - Mammogram: Ordered - Stool Guaiac Testing:  Not Ordered. Colonoscopy up to date. To repeat every 5 years due to family h/o colon cancer, due next year.  - Labs: None.  Recent labs reviewed in [facility], performed by PCP.  - Routine preventative health maintenance measures emphasized: Exercise/Diet/Weight control, Alcohol/Substance use risks and Stress Management - Patient with h/o PE.  To avoid estrogen containing products if remedies ever desired for menopausal vasomotor symptoms. To also discuss OTC herbal remedies with provider before initiation.  Declines flu vaccine. Return to [place], MD [facility] Care

## 2018-10-26 NOTE — Patient Instructions (Signed)
Preventive Care 40-57 Years Old, Female Preventive care refers to visits with your health care provider and lifestyle choices that can promote health and wellness. This includes:  A yearly physical exam. This may also be called an annual well check.  Regular dental visits and eye exams.  Immunizations.  Screening for certain conditions.  Healthy lifestyle choices, such as eating a healthy diet, getting regular exercise, not using drugs or products that contain nicotine and tobacco, and limiting alcohol use. What can I expect for my preventive care visit? Physical exam Your health care provider will check your:  Height and weight. This may be used to calculate body mass index (BMI), which tells if you are at a healthy weight.  Heart rate and blood pressure.  Skin for abnormal spots. Counseling Your health care provider may ask you questions about your:  Alcohol, tobacco, and drug use.  Emotional well-being.  Home and relationship well-being.  Sexual activity.  Eating habits.  Work and work environment.  Method of birth control.  Menstrual cycle.  Pregnancy history. What immunizations do I need?  Influenza (flu) vaccine  This is recommended every year. Tetanus, diphtheria, and pertussis (Tdap) vaccine  You may need a Td booster every 10 years. Varicella (chickenpox) vaccine  You may need this if you have not been vaccinated. Zoster (shingles) vaccine  You may need this after age 60. Measles, mumps, and rubella (MMR) vaccine  You may need at least one dose of MMR if you were born in 1957 or later. You may also need a second dose. Pneumococcal conjugate (PCV13) vaccine  You may need this if you have certain conditions and were not previously vaccinated. Pneumococcal polysaccharide (PPSV23) vaccine  You may need one or two doses if you smoke cigarettes or if you have certain conditions. Meningococcal conjugate (MenACWY) vaccine  You may need this if you  have certain conditions. Hepatitis A vaccine  You may need this if you have certain conditions or if you travel or work in places where you may be exposed to hepatitis A. Hepatitis B vaccine  You may need this if you have certain conditions or if you travel or work in places where you may be exposed to hepatitis B. Haemophilus influenzae type b (Hib) vaccine  You may need this if you have certain conditions. Human papillomavirus (HPV) vaccine  If recommended by your health care provider, you may need three doses over 6 months. You may receive vaccines as individual doses or as more than one vaccine together in one shot (combination vaccines). Talk with your health care provider about the risks and benefits of combination vaccines. What tests do I need? Blood tests  Lipid and cholesterol levels. These may be checked every 5 years, or more frequently if you are over 50 years old.  Hepatitis C test.  Hepatitis B test. Screening  Lung cancer screening. You may have this screening every year starting at age 55 if you have a 30-pack-year history of smoking and currently smoke or have quit within the past 15 years.  Colorectal cancer screening. All adults should have this screening starting at age 50 and continuing until age 75. Your health care provider may recommend screening at age 45 if you are at increased risk. You will have tests every 1-10 years, depending on your results and the type of screening test.  Diabetes screening. This is done by checking your blood sugar (glucose) after you have not eaten for a while (fasting). You may have this   done every 1-3 years.  Mammogram. This may be done every 1-2 years. Talk with your health care provider about when you should start having regular mammograms. This may depend on whether you have a family history of breast cancer.  BRCA-related cancer screening. This may be done if you have a family history of breast, ovarian, tubal, or peritoneal  cancers.  Pelvic exam and Pap test. This may be done every 3 years starting at age 23. Starting at age 86, this may be done every 5 years if you have a Pap test in combination with an HPV test. Other tests  Sexually transmitted disease (STD) testing.  Bone density scan. This is done to screen for osteoporosis. You may have this scan if you are at high risk for osteoporosis. Follow these instructions at home: Eating and drinking  Eat a diet that includes fresh fruits and vegetables, whole grains, lean protein, and low-fat dairy.  Take vitamin and mineral supplements as recommended by your health care provider.  Do not drink alcohol if: ? Your health care provider tells you not to drink. ? You are pregnant, may be pregnant, or are planning to become pregnant.  If you drink alcohol: ? Limit how much you have to 0-1 drink a day. ? Be aware of how much alcohol is in your drink. In the [place] one drink equals one 12 oz bottle of beer (355 mL), one 5 oz glass of wine (148 mL), or one 1 oz glass of hard liquor (44 mL). Lifestyle  Take daily care of your teeth and gums.  Stay active. Exercise for at least 30 minutes on 5 or more days each week.  Do not use any products that contain nicotine or tobacco, such as cigarettes, e-cigarettes, and chewing tobacco. If you need help quitting, ask your health care provider.  If you are sexually active, practice safe sex. Use a condom or other form of birth control (contraception) in order to prevent pregnancy and STIs (sexually transmitted infections).  If told by your health care provider, take low-dose aspirin daily starting at age 27. What's next?  Visit your health care provider once a year for a well check visit.  Ask your health care provider how often you should have your eyes and teeth checked.  Stay up to date on all vaccines. This information is not intended to replace advice given to you by your health care provider. Make sure you  discuss any questions you have with your health care provider. Document Released: 02/20/2015 Document Revised: 10/05/2017 Document Reviewed: 10/05/2017 Elsevier Patient Education  2020 [place] self-awareness is knowing how your breasts look and feel. Doing breast self-awareness is important. It allows you to catch a breast problem early while it is still small and can be treated. All women should do breast self-awareness, including women who have had breast implants. Tell your doctor if you notice a change in your breasts. What you need:  A mirror.  A well-lit room. How to do a breast self-exam A breast self-exam is one way to learn what is normal for your breasts and to check for changes. To do a breast self-exam: Look for changes  1. Take off all the clothes above your waist. 2. Stand in front of a mirror in a room with good lighting. 3. Put your hands on your hips. 4. Push your hands down. 5. Look at your breasts and nipples in the mirror to see if one breast or nipple looks  different from the other. Check to see if: ? The shape of one breast is different. ? The size of one breast is different. ? There are wrinkles, dips, and bumps in one breast and not the other. 6. Look at each breast for changes in the skin, such as: ? Redness. ? Scaly areas. 7. Look for changes in your nipples, such as: ? Liquid around the nipples. ? Bleeding. ? Dimpling. ? Redness. ? A change in where the nipples are. Feel for changes  1. Lie on your back on the floor. 2. Feel each breast. To do this, follow these steps: ? Pick a breast to feel. ? Put the arm closest to that breast above your head. ? Use your other arm to feel the nipple area of your breast. Feel the area with the pads of your three middle fingers by making small circles with your fingers. For the first circle, press lightly. For the second circle, press harder. For the third circle, press even harder.  ? Keep making circles with your fingers at the different pressures as you move down your breast. Stop when you feel your ribs. ? Move your fingers a little toward the center of your body. ? Start making circles with your fingers again, this time going up until you reach your collarbone. ? Keep making up-and-down circles until you reach your armpit. Remember to keep using the three pressures. ? Feel the other breast in the same way. 3. Sit or stand in the tub or shower. 4. With soapy water on your skin, feel each breast the same way you did in step 2 when you were lying on the floor. Write down what you find Writing down what you find can help you remember what to tell your doctor. Write down:  What is normal for each breast.  Any changes you find in each breast, including: ? The kind of changes you find. ? Whether you have pain. ? Size and location of any lumps.  When you last had your menstrual period. General tips  Check your breasts every month.  If you are breastfeeding, the best time to check your breasts is after you feed your baby or after you use a breast pump.  If you get menstrual periods, the best time to check your breasts is 5-7 days after your menstrual period is over.  With time, you will become comfortable with the self-exam, and you will begin to know if there are changes in your breasts. Contact a doctor if you:  See a change in the shape or size of your breasts or nipples.  See a change in the skin of your breast or nipples, such as red or scaly skin.  Have fluid coming from your nipples that is not normal.  Find a lump or thick area that was not there before.  Have pain in your breasts.  Have any concerns about your breast health. Summary  Breast self-awareness includes looking for changes in your breasts, as well as feeling for changes within your breasts.  Breast self-awareness should be done in front of a mirror in a well-lit room.  You should  check your breasts every month. If you get menstrual periods, the best time to check your breasts is 5-7 days after your menstrual period is over.  Let your doctor know of any changes you see in your breasts, including changes in size, changes on the skin, pain or tenderness, or fluid from your nipples that is not normal. This  information is not intended to replace advice given to you by your health care provider. Make sure you discuss any questions you have with your health care provider. Document Released: 07/13/2007 Document Revised: 09/12/2017 Document Reviewed: 09/12/2017 Elsevier Patient Education  2020 [employer].

## 2018-10-26 NOTE — Progress Notes (Signed)
Pt is present for annual exam. Pt stated that she was doing well no problems. Pt declined flu vaccine.  

## 2018-10-31 LAB — CYTOLOGY - PAP
Diagnosis: NEGATIVE
High risk HPV: NEGATIVE
Molecular Disclaimer: 56
Molecular Disclaimer: DETECTED
Molecular Disclaimer: NORMAL

## 2019-08-16 ENCOUNTER — Ambulatory Visit
Admission: RE | Admit: 2019-08-16 | Discharge: 2019-08-16 | Disposition: A | Discharge status: Home / Self Care | Payer: Managed Care, Other (non HMO) | Source: Ambulatory Visit | Attending: Obstetrics and Gynecology | Admitting: Obstetrics and Gynecology

## 2019-08-16 DIAGNOSIS — Z1231 Encounter for screening mammogram for malignant neoplasm of breast: Secondary | ICD-10-CM | POA: Insufficient documentation

## 2019-08-26 DIAGNOSIS — E559 Vitamin D deficiency, unspecified: Secondary | ICD-10-CM | POA: Insufficient documentation

## 2019-08-26 DIAGNOSIS — Z86718 Personal history of other venous thrombosis and embolism: Secondary | ICD-10-CM | POA: Insufficient documentation

## 2019-08-26 DIAGNOSIS — Z86711 Personal history of pulmonary embolism: Secondary | ICD-10-CM | POA: Insufficient documentation

## 2019-09-04 DIAGNOSIS — M5136 Other intervertebral disc degeneration, lumbar region: Secondary | ICD-10-CM | POA: Insufficient documentation

## 2019-09-04 DIAGNOSIS — E6609 Other obesity due to excess calories: Secondary | ICD-10-CM | POA: Insufficient documentation

## 2019-09-06 HISTORY — PX: BACK SURGERY: SHX140

## 2019-10-25 DIAGNOSIS — E538 Deficiency of other specified B group vitamins: Secondary | ICD-10-CM | POA: Insufficient documentation

## 2019-11-01 ENCOUNTER — Encounter: Payer: Managed Care, Other (non HMO) | Admitting: Obstetrics and Gynecology

## 2019-11-21 ENCOUNTER — Other Ambulatory Visit
Admission: RE | Admit: 2019-11-21 | Discharge: 2019-11-21 | Disposition: A | Discharge status: Home / Self Care | Payer: Managed Care, Other (non HMO) | Source: Ambulatory Visit | Attending: Internal Medicine | Admitting: Internal Medicine

## 2019-11-21 DIAGNOSIS — Z20822 Contact with and (suspected) exposure to covid-19: Secondary | ICD-10-CM | POA: Insufficient documentation

## 2019-11-21 DIAGNOSIS — Z01812 Encounter for preprocedural laboratory examination: Secondary | ICD-10-CM | POA: Diagnosis present

## 2019-11-21 LAB — SARS CORONAVIRUS 2 (TAT 6-24 HRS): SARS Coronavirus 2: NEGATIVE

## 2019-11-22 ENCOUNTER — Encounter: Payer: Self-pay | Admitting: Internal Medicine

## 2019-11-25 ENCOUNTER — Ambulatory Visit
Admission: RE | Admit: 2019-11-25 | Discharge: 2019-11-25 | Disposition: A | Discharge status: Home / Self Care | Payer: Managed Care, Other (non HMO) | Attending: Internal Medicine | Admitting: Internal Medicine

## 2019-11-25 ENCOUNTER — Encounter: Payer: Self-pay | Admitting: Internal Medicine

## 2019-11-25 ENCOUNTER — Encounter
Admission: RE | Disposition: A | Discharge status: Home / Self Care | Payer: Self-pay | Source: Home / Self Care | Attending: Internal Medicine

## 2019-11-25 ENCOUNTER — Ambulatory Visit: Payer: Managed Care, Other (non HMO) | Admitting: Certified Registered Nurse Anesthetist

## 2019-11-25 ENCOUNTER — Other Ambulatory Visit: Payer: Self-pay

## 2019-11-25 DIAGNOSIS — K219 Gastro-esophageal reflux disease without esophagitis: Secondary | ICD-10-CM | POA: Insufficient documentation

## 2019-11-25 DIAGNOSIS — F419 Anxiety disorder, unspecified: Secondary | ICD-10-CM | POA: Insufficient documentation

## 2019-11-25 DIAGNOSIS — Z1211 Encounter for screening for malignant neoplasm of colon: Secondary | ICD-10-CM | POA: Diagnosis not present

## 2019-11-25 DIAGNOSIS — Z881 Allergy status to other antibiotic agents status: Secondary | ICD-10-CM | POA: Insufficient documentation

## 2019-11-25 DIAGNOSIS — E039 Hypothyroidism, unspecified: Secondary | ICD-10-CM | POA: Diagnosis not present

## 2019-11-25 DIAGNOSIS — F429 Obsessive-compulsive disorder, unspecified: Secondary | ICD-10-CM | POA: Insufficient documentation

## 2019-11-25 DIAGNOSIS — Z86711 Personal history of pulmonary embolism: Secondary | ICD-10-CM | POA: Diagnosis not present

## 2019-11-25 DIAGNOSIS — I10 Essential (primary) hypertension: Secondary | ICD-10-CM | POA: Diagnosis not present

## 2019-11-25 DIAGNOSIS — Z800 Family history of malignant neoplasm of digestive organs: Secondary | ICD-10-CM | POA: Insufficient documentation

## 2019-11-25 DIAGNOSIS — Z90710 Acquired absence of both cervix and uterus: Secondary | ICD-10-CM | POA: Diagnosis not present

## 2019-11-25 DIAGNOSIS — G4730 Sleep apnea, unspecified: Secondary | ICD-10-CM | POA: Diagnosis not present

## 2019-11-25 DIAGNOSIS — E785 Hyperlipidemia, unspecified: Secondary | ICD-10-CM | POA: Diagnosis not present

## 2019-11-25 DIAGNOSIS — Z9049 Acquired absence of other specified parts of digestive tract: Secondary | ICD-10-CM | POA: Insufficient documentation

## 2019-11-25 DIAGNOSIS — K581 Irritable bowel syndrome with constipation: Secondary | ICD-10-CM | POA: Insufficient documentation

## 2019-11-25 DIAGNOSIS — H409 Unspecified glaucoma: Secondary | ICD-10-CM | POA: Diagnosis not present

## 2019-11-25 DIAGNOSIS — K640 First degree hemorrhoids: Secondary | ICD-10-CM | POA: Diagnosis not present

## 2019-11-25 DIAGNOSIS — R7303 Prediabetes: Secondary | ICD-10-CM | POA: Insufficient documentation

## 2019-11-25 HISTORY — DX: Personal history of other diseases of the digestive system: Z87.19

## 2019-11-25 HISTORY — DX: Unspecified glaucoma: H40.9

## 2019-11-25 HISTORY — PX: COLONOSCOPY WITH PROPOFOL: SHX5780

## 2019-11-25 HISTORY — DX: Hypothyroidism, unspecified: E03.9

## 2019-11-25 HISTORY — DX: Dysphagia, unspecified: R13.10

## 2019-11-25 HISTORY — DX: Gastro-esophageal reflux disease without esophagitis: K21.9

## 2019-11-25 HISTORY — DX: Obsessive-compulsive disorder, unspecified: F42.9

## 2019-11-25 HISTORY — DX: Prediabetes: R73.03

## 2019-11-25 HISTORY — DX: Hyperlipidemia, unspecified: E78.5

## 2019-11-25 HISTORY — DX: Essential (primary) hypertension: I10

## 2019-11-25 SURGERY — COLONOSCOPY WITH PROPOFOL
Anesthesia: General

## 2019-11-25 MED ORDER — LIDOCAINE HCL (PF) 1 % IJ SOLN
INTRAMUSCULAR | Status: AC
  Administered 2019-11-25: 0.5 mL
  Filled 2019-11-25: qty 2

## 2019-11-25 MED ORDER — PROPOFOL 500 MG/50ML IV EMUL
INTRAVENOUS | Status: DC | PRN
  Administered 2019-11-25: 100 ug/kg/min via INTRAVENOUS

## 2019-11-25 MED ORDER — PROPOFOL 10 MG/ML IV BOLUS
INTRAVENOUS | Status: DC | PRN
  Administered 2019-11-25 (×2): 40 mg via INTRAVENOUS
  Administered 2019-11-25: 50 mg via INTRAVENOUS

## 2019-11-25 MED ORDER — SODIUM CHLORIDE 0.9 % IV SOLN: INTRAVENOUS | Status: DC

## 2019-11-25 NOTE — Op Note (Signed)
[facility] Gastroenterology Patient Name: Sydney Soto Procedure Date: 11/25/2019 3:25 PM MRN: [id number] Account #: [id number] Date of Birth: 1960-05-18 Admit Type: Outpatient Age: 59 Room: [facility] ENDO ROOM 3 Gender: Female Note Status: Finalized Procedure:             Colonoscopy Indications:           Screening in patient at increased risk: Family history                         of 1st-degree relative with colorectal cancer Providers:             Boykin Nearing. Norma Fredrickson MD, MD Referring MD:          Duane Lope. Judithann Sheen, MD (Referring MD) Medicines:             Propofol per Anesthesia Complications:         No immediate complications. Procedure:             Pre-Anesthesia Assessment:                        - The risks and benefits of the procedure and the                         sedation options and risks were discussed with the                         patient. All questions were answered and informed                         consent was obtained.                        - Patient identification and proposed procedure were                         verified prior to the procedure by the nurse. The                         procedure was verified in the procedure room.                        - ASA Grade Assessment: III - A patient with severe                         systemic disease.                        - After reviewing the risks and benefits, the patient                         was deemed in satisfactory condition to undergo the                         procedure.                        After obtaining informed consent, the colonoscope was                         passed under  direct vision. Throughout the procedure,                         the patient's blood pressure, pulse, and oxygen                         saturations were monitored continuously. The                         Colonoscope was introduced through the anus and                         advanced to the  the cecum, identified by appendiceal                         orifice and ileocecal valve. The colonoscopy was                         performed without difficulty. The patient tolerated                         the procedure well. The quality of the bowel                         preparation was good. The ileocecal valve, appendiceal                         orifice, and rectum were photographed. Findings:      The perianal and digital rectal examinations were normal. Pertinent       negatives include normal sphincter tone and no palpable rectal lesions.      Non-bleeding internal hemorrhoids were found during retroflexion. The       hemorrhoids were Grade I (internal hemorrhoids that do not prolapse).      The colon (entire examined portion) appeared normal.      Non-bleeding internal hemorrhoids were found during retroflexion. The       hemorrhoids were Grade I (internal hemorrhoids that do not prolapse). Impression:            - Non-bleeding internal hemorrhoids.                        - The entire examined colon is normal.                        - Non-bleeding internal hemorrhoids.                        - No specimens collected. Recommendation:        - Patient has a contact number available for                         emergencies. The signs and symptoms of potential                         delayed complications were discussed with the patient.                         Return to normal activities tomorrow. Written  discharge instructions were provided to the patient.                        - Resume previous diet.                        - Continue present medications.                        - Repeat colonoscopy in 10 years for screening                         purposes.                        - Return to GI office PRN.                        - The findings and recommendations were discussed with                         the patient. Procedure Code(s):     ---  Professional ---                        B7169, Colorectal cancer screening; colonoscopy on                         individual at high risk Diagnosis Code(s):     --- Professional ---                        K64.0, First degree hemorrhoids                        Z80.0, Family history of malignant neoplasm of                         digestive organs CPT copyright 2019 American Medical Association. All rights reserved. The codes documented in this report are preliminary and upon coder review may  be revised to meet current compliance requirements. Stanton Kidney MD, MD 11/25/2019 3:58:11 PM This report has been signed electronically. Number of Addenda: 0 Note Initiated On: 11/25/2019 3:25 PM Scope Withdrawal Time: 0 hours 8 minutes 31 seconds  Total Procedure Duration: 0 hours 11 minutes 31 seconds  Estimated Blood Loss:  Estimated blood loss: none.      [facility]

## 2019-11-25 NOTE — Anesthesia Preprocedure Evaluation (Addendum)
Anesthesia Evaluation  Patient identified by MRN, date of birth, ID band Patient awake    Reviewed: Allergy & Precautions, H&P , NPO status , Patient's Chart, lab work & pertinent test results  History of Anesthesia Complications Negative for: history of anesthetic complications  Airway Mallampati: III  TM Distance: >3 FB Neck ROM: full    Dental  (+) Teeth Intact   Pulmonary sleep apnea , neg COPD,    breath sounds clear to auscultation       Cardiovascular hypertension, (-) angina(-) Past MI and (-) Cardiac Stents (-) dysrhythmias  Rhythm:regular Rate:Normal  H/o PE bilateral   Neuro/Psych PSYCHIATRIC DISORDERS Anxiety negative neurological ROS     GI/Hepatic Neg liver ROS, GERD  Controlled,  Endo/Other  Hypothyroidism   Renal/GU negative Renal ROS  negative genitourinary   Musculoskeletal   Abdominal   Peds  Hematology negative hematology ROS (+)   Anesthesia Other Findings Obesity  Past Medical History: No date: Anxiety No date: Dysphagia, unspecified No date: GERD (gastroesophageal reflux disease) No date: Glaucoma No date: History of IBS     Comment:  WITH CONSTIPATION No date: Hyperlipidemia No date: Hypertension No date: Hypothyroidism No date: OCD (obsessive compulsive disorder) No date: Pre-diabetes     Comment:  DIET CONTROLLED No date: Pulmonary embolism (HCC)     Comment:  Bilateral  No date: Thyroid disease  Past Surgical History: No date: ABDOMINAL HYSTERECTOMY No date: APPENDECTOMY No date: CHOLECYSTECTOMY 07/02/2014: COLONOSCOPY; N/A     Comment:  Procedure: COLONOSCOPY;  Surgeon: Scot Jun, MD;               Location: [facility] ENDOSCOPY;  Service: Endoscopy;                Laterality: N/A; No date: DILATION AND CURETTAGE OF UTERUS 07/02/2014: ESOPHAGOGASTRODUODENOSCOPY; N/A     Comment:  Procedure: ESOPHAGOGASTRODUODENOSCOPY (EGD);  Surgeon:               Scot Jun, MD;   Location[facility] ENDOSCOPY;                Service: Endoscopy;  Laterality: N/A; No date: TUBAL LIGATION  BMI    Body Mass Index: 36.61 kg/m      Reproductive/Obstetrics negative OB ROS                            Anesthesia Physical Anesthesia Plan  ASA: III  Anesthesia Plan: General   Post-op Pain Management:    Induction:   PONV Risk Score and Plan: Propofol infusion and TIVA  Airway Management Planned: Simple Face Mask  Additional Equipment:   Intra-op Plan:   Post-operative Plan:   Informed Consent: I have reviewed the patients History and Physical, chart, labs and discussed the procedure including the risks, benefits and alternatives for the proposed anesthesia with the patient or authorized representative who has indicated his/her understanding and acceptance.     Dental Advisory Given  Plan Discussed with: Anesthesiologist, CRNA and Surgeon  Anesthesia Plan Comments:         Anesthesia Quick Evaluation

## 2019-11-25 NOTE — Interval H&P Note (Signed)
History and Physical Interval Note:  11/25/2019 2:45 PM  Sydney Soto  has presented today for surgery, with the diagnosis of SCREENING.  The various methods of treatment have been discussed with the patient and family. After consideration of risks, benefits and other options for treatment, the patient has consented to  Procedure(s): COLONOSCOPY WITH PROPOFOL (N/A) as a surgical intervention.  The patient's history has been reviewed, patient examined, no change in status, stable for surgery.  I have reviewed the patient's chart and labs.  Questions were answered to the patient's satisfaction.     [place], [place]

## 2019-11-25 NOTE — Transfer of Care (Signed)
Immediate Anesthesia Transfer of Care Note  Patient: Akasha Bradd Canary  Procedure(s) Performed: COLONOSCOPY WITH PROPOFOL (N/A )  Patient Location: PACU and Endoscopy Unit  Anesthesia Type:General  Level of Consciousness: awake, drowsy and patient cooperative  Airway & Oxygen Therapy: Patient Spontanous Breathing  Post-op Assessment: Report given to RN and Post -op Vital signs reviewed and stable  Post vital signs: Reviewed and stable  Last Vitals:  Vitals Value Taken Time  BP 108/70 11/25/19 1556  Temp 36.2 C 11/25/19 1556  Pulse 75 11/25/19 1556  Resp 13 11/25/19 1556  SpO2 94 % 11/25/19 1556    Last Pain:  Vitals:   11/25/19 1556  TempSrc: Temporal  PainSc:          Complications: No complications documented.

## 2019-11-25 NOTE — H&P (Signed)
Outpatient short stay form Pre-procedure 11/25/2019 2:44 PM Lanie Schelling K. Norma Fredrickson, M.D.  Primary Physician: Aram Beecham, M.D.  Reason for visit:  Family history of colon cancer and polyps.  History of present illness:  Sydney Soto presenting for family history of colon cancer. Soto denies any change in bowel habits, rectal bleeding or involuntary weight loss.     Current Facility-Administered Medications:  .  lidocaine (PF) (XYLOCAINE) 1 % injection, , , ,  .  0.9 %  sodium chloride infusion, , Intravenous, Continuous, Augusten Lipkin, Boykin Nearing, MD  Medications Prior to Admission  Medication Sig Dispense Refill Last Dose  . aspirin 325 MG tablet Take 325 mg by mouth daily.   Past Week at Unknown time  . bisoprolol-hydrochlorothiazide (ZIAC) 5-6.25 MG tablet Take 1 tablet by mouth daily.   11/25/2019 at 0600  . buPROPion (WELLBUTRIN SR) 150 MG 12 hr tablet TK 1 T PO 2 TIMES D  3 Past Week at 0800  . cyanocobalamin (,VITAMIN B-12,) 1000 MCG/ML injection Inject 1 mL into the muscle every 30 (thirty) days.   Past Week at Unknown time  . DULoxetine (CYMBALTA) 30 MG capsule Take 30-60 mg by mouth 2 (two) times daily. 2 cap in the morning and 1 cap at night   Past Week at Unknown time  . ferrous sulfate 324 MG TBEC Take 324 mg by mouth daily with breakfast.   Past Week at Unknown time  . gabapentin (NEURONTIN) 400 MG capsule Take by mouth 3 (three) times daily. TOTAL OF 400 MGS   Past Week at Unknown time  . levothyroxine (SYNTHROID) 50 MCG tablet Take 50 mcg by mouth daily before breakfast.   Past Week at Unknown time  . magnesium oxide (MAG-OX) 400 MG tablet Take 400 mg by mouth daily.   Past Week at Unknown time     Allergies  Allergen Reactions  . Erythromycin Nausea Only     Past Medical History:  Diagnosis Date  . Anxiety   . Dysphagia, unspecified   . GERD (gastroesophageal reflux disease)   . Glaucoma   . History of IBS    WITH CONSTIPATION  . Hyperlipidemia   .  Hypertension   . Hypothyroidism   . OCD (obsessive compulsive disorder)   . Pre-diabetes    DIET CONTROLLED  . Pulmonary embolism (HCC)    Bilateral   . Thyroid disease     Review of systems:  Otherwise negative.    Physical Exam  Gen: Alert, oriented. Appears stated age.  HEENT: [place]/AT. PERRLA. Lungs: CTA, no wheezes. CV: RR nl S1, S2. Abd: soft, benign, no masses. BS+ Ext: No edema. Pulses 2+    Planned procedures: Proceed with colonoscopy. The Soto understands the nature of the planned procedure, indications, risks, alternatives and potential complications including but not limited to bleeding, infection, perforation, damage to internal organs and possible oversedation/side effects from anesthesia. The Soto agrees and gives consent to proceed.  Please refer to procedure notes for findings, recommendations and Soto disposition/instructions.     Rafferty Postlewait K. Norma Fredrickson, M.D. Gastroenterology 11/25/2019  2:44 PM

## 2019-11-26 ENCOUNTER — Encounter: Payer: Self-pay | Admitting: Internal Medicine

## 2019-11-26 NOTE — Anesthesia Postprocedure Evaluation (Signed)
Anesthesia Post Note  Patient: Sydney Soto  Procedure(s) Performed: COLONOSCOPY WITH PROPOFOL (N/A )  Patient location during evaluation: Endoscopy Anesthesia Type: General Level of consciousness: awake and alert and oriented Pain management: pain level controlled Vital Signs Assessment: post-procedure vital signs reviewed and stable Respiratory status: spontaneous breathing Cardiovascular status: blood pressure returned to baseline Anesthetic complications: no   No complications documented.   Last Vitals:  Vitals:   11/25/19 1616 11/25/19 1626  BP: 126/77 125/77  Pulse: 65 64  Resp: 12 11  Temp:    SpO2: 97% 97%    Last Pain:  Vitals:   11/25/19 1626  TempSrc:   PainSc: 0-No pain                 Merced Hanners

## 2019-12-05 ENCOUNTER — Ambulatory Visit: Admit: 2019-12-05 | Payer: Managed Care, Other (non HMO) | Admitting: Internal Medicine

## 2019-12-05 SURGERY — COLONOSCOPY
Anesthesia: General

## 2019-12-11 ENCOUNTER — Other Ambulatory Visit: Payer: Self-pay

## 2019-12-11 ENCOUNTER — Encounter: Payer: Self-pay | Admitting: Obstetrics and Gynecology

## 2019-12-11 ENCOUNTER — Other Ambulatory Visit (INDEPENDENT_AMBULATORY_CARE_PROVIDER_SITE_OTHER): Payer: Managed Care, Other (non HMO)

## 2019-12-11 ENCOUNTER — Ambulatory Visit (INDEPENDENT_AMBULATORY_CARE_PROVIDER_SITE_OTHER): Payer: Managed Care, Other (non HMO) | Admitting: Obstetrics and Gynecology

## 2019-12-11 VITALS — BP 122/83 | HR 75 | Ht 65.0 in | Wt 227.4 lb

## 2019-12-11 DIAGNOSIS — R102 Pelvic and perineal pain: Secondary | ICD-10-CM

## 2019-12-11 DIAGNOSIS — N8110 Cystocele, unspecified: Secondary | ICD-10-CM | POA: Diagnosis not present

## 2019-12-11 LAB — POCT URINALYSIS DIPSTICK
Bilirubin, UA: NEGATIVE
Blood, UA: NEGATIVE
Glucose, UA: NEGATIVE
Ketones, UA: NEGATIVE
Leukocytes, UA: NEGATIVE
Nitrite, UA: NEGATIVE
Protein, UA: NEGATIVE
Spec Grav, UA: >=1.03 — AB (ref 1.010–1.025)
Urobilinogen, UA: 0.2 E.U./dL
pH, UA: 5 (ref 5.0–8.0)

## 2019-12-11 NOTE — Progress Notes (Signed)
    GYNECOLOGY PROGRESS NOTE  Subjective:    Patient ID: Sydney Soto, female    DOB: [birth year], 59 y.o.   MRN: [id number]  HPI  Patient is a 59 y.o. G52P2002 female who presents for complaints of sharp shooting pelvic pain with onset 2 days ago and vaginal pressure. Had sudden onset. Notes that her body has been "out of balance" to a degree ever since she had her colonoscopy  3 weeks ago, and has been on keto diet.  Pain tends to shoot up from the vaginal area up through the pelvis. Has occurred several times over the past few days. Took Tylenol and Ibuprofen yesterday, went to sleep, and when she woke up the pressure feeling was still there. Denies constipation or urinary complaints.  Did see a small string of blood after going to the restroom yesterday.   The following portions of the patient's history were reviewed and updated as appropriate: allergies, current medications, past family history, past medical history, past social history, past surgical history and problem list.  Review of Systems Pertinent items noted in HPI and remainder of comprehensive ROS otherwise negative.   Objective:   Blood pressure 122/83, pulse 75, height 5\' 5"  (1.651 m), weight 227 lb 6.4 oz (103.1 kg). General appearance: alert and no distress Abdomen: normal findings: no masses palpable and soft and abnormal findings:  mild tenderness in the lower abdomen (mostly near midline) Pelvic: external genitalia normal, rectovaginal septum normal.  Vagina with scant thin white discharge, no odor. Grade 1 cystocele.  Uterus and cervix surgically absent Adnexae non-palpable, nontender bilaterally.  Extremities: extremities normal, atraumatic, no cyanosis or edema Neurologic: Grossly normal   Assessment:   Pelvic pain (acute)  Plan:   1. Unclear cause of pelvic pain at this time.  No evidence of vaginal infection or bleeding.  UA collected to rule out occult UTI.  Also will order pelvic ultrasound to r/o  adnexal cysts. Is s/p hysterectomy.  2. Small cystocele noted on today's exam, however not significant enough to be the cause of patient's symptoms. Otherwise asymptomatic. Continue to monitor.  3. Advised to continue Tylenol/NSAIDs for now. Return if symptoms worsen or fail to improve.    , MD [facility]

## 2019-12-11 NOTE — Patient Instructions (Addendum)
Pelvic Pain, Female Pelvic pain is pain in your lower abdomen, below your belly button and between your hips. The pain may start suddenly (be acute), keep coming back (be recurring), or last a long time (become chronic). Pelvic pain that lasts longer than 6 months is considered chronic. Pelvic pain may affect your:  Reproductive organs.  Urinary system.  Digestive tract.  Musculoskeletal system. There are many potential causes of pelvic pain. Sometimes, the pain can be a result of digestive or urinary conditions, strained muscles or ligaments, or reproductive conditions. Sometimes the cause of pelvic pain is not known. Follow these instructions at home:   Take over-the-counter and prescription medicines only as told by your health care provider.  Rest as told by your health care provider.  Do not have sex if it hurts.  Keep a journal of your pelvic pain. Write down: ? When the pain started. ? Where the pain is located. ? What seems to make the pain better or worse, such as food or your period (menstrual cycle). ? Any symptoms you have along with the pain.  Keep all follow-up visits as told by your health care provider. This is important. Contact a health care provider if:  Medicine does not help your pain.  Your pain comes back.  You have new symptoms.  You have abnormal vaginal discharge or bleeding, including bleeding after menopause.  You have a fever or chills.  You are constipated.  You have blood in your urine or stool.  You have foul-smelling urine.  You feel weak or light-headed. Get help right away if:  You have sudden severe pain.  Your pain gets steadily worse.  You have severe pain along with fever, nausea, vomiting, or excessive sweating.  You lose consciousness. Summary  Pelvic pain is pain in your lower abdomen, below your belly button and between your hips.  There are many potential causes of pelvic pain.  Keep a journal of your pelvic  pain. This information is not intended to replace advice given to you by your health care provider. Make sure you discuss any questions you have with your health care provider. Document Revised: 07/12/2017 Document Reviewed: 07/12/2017 Elsevier Patient Education  2020 [employer]  

## 2019-12-11 NOTE — Progress Notes (Signed)
Pt present for vaginal pain and pressure. Pt stated noticing the pain/pressure about 2 days ago. Pt stated having a colon screening 3 weeks ago. Pt stated it felt like lighting/sharp pain in the vaginal area and also seen a small trace of blood nothing since then along with cramping.

## 2019-12-12 ENCOUNTER — Ambulatory Visit (INDEPENDENT_AMBULATORY_CARE_PROVIDER_SITE_OTHER): Payer: Managed Care, Other (non HMO)

## 2019-12-12 ENCOUNTER — Other Ambulatory Visit: Payer: Self-pay | Admitting: Obstetrics and Gynecology

## 2019-12-12 DIAGNOSIS — R52 Pain, unspecified: Secondary | ICD-10-CM

## 2019-12-12 DIAGNOSIS — R102 Pelvic and perineal pain: Secondary | ICD-10-CM

## 2019-12-13 LAB — URINE CULTURE

## 2020-01-07 NOTE — Progress Notes (Signed)
Pt present for annual exam. Pt stated that she was doing well and denies any gyn issues at this time. Pt declined flu vaccine at this time.  

## 2020-01-07 NOTE — Patient Instructions (Addendum)
Preventive Care 40-58 Years Old, Female Preventive care refers to visits with your health care provider and lifestyle choices that can promote health and wellness. This includes:  A yearly physical exam. This may also be called an annual well check.  Regular dental visits and eye exams.  Immunizations.  Screening for certain conditions.  Healthy lifestyle choices, such as eating a healthy diet, getting regular exercise, not using drugs or products that contain nicotine and tobacco, and limiting alcohol use. What can I expect for my preventive care visit? Physical exam Your health care provider will check your:  Height and weight. This may be used to calculate body mass index (BMI), which tells if you are at a healthy weight.  Heart rate and blood pressure.  Skin for abnormal spots. Counseling Your health care provider may ask you questions about your:  Alcohol, tobacco, and drug use.  Emotional well-being.  Home and relationship well-being.  Sexual activity.  Eating habits.  Work and work environment.  Method of birth control.  Menstrual cycle.  Pregnancy history. What immunizations do I need?  Influenza (flu) vaccine  This is recommended every year. Tetanus, diphtheria, and pertussis (Tdap) vaccine  You may need a Td booster every 10 years. Varicella (chickenpox) vaccine  You may need this if you have not been vaccinated. Zoster (shingles) vaccine  You may need this after age 60. Measles, mumps, and rubella (MMR) vaccine  You may need at least one dose of MMR if you were born in 1957 or later. You may also need a second dose. Pneumococcal conjugate (PCV13) vaccine  You may need this if you have certain conditions and were not previously vaccinated. Pneumococcal polysaccharide (PPSV23) vaccine  You may need one or two doses if you smoke cigarettes or if you have certain conditions. Meningococcal conjugate (MenACWY) vaccine  You may need this if you  have certain conditions. Hepatitis A vaccine  You may need this if you have certain conditions or if you travel or work in places where you may be exposed to hepatitis A. Hepatitis B vaccine  You may need this if you have certain conditions or if you travel or work in places where you may be exposed to hepatitis B. Haemophilus influenzae type b (Hib) vaccine  You may need this if you have certain conditions. Human papillomavirus (HPV) vaccine  If recommended by your health care provider, you may need three doses over 6 months. You may receive vaccines as individual doses or as more than one vaccine together in one shot (combination vaccines). Talk with your health care provider about the risks and benefits of combination vaccines. What tests do I need? Blood tests  Lipid and cholesterol levels. These may be checked every 5 years, or more frequently if you are over 50 years old.  Hepatitis C test.  Hepatitis B test. Screening  Lung cancer screening. You may have this screening every year starting at age 55 if you have a 30-pack-year history of smoking and currently smoke or have quit within the past 15 years.  Colorectal cancer screening. All adults should have this screening starting at age 50 and continuing until age 75. Your health care provider may recommend screening at age 45 if you are at increased risk. You will have tests every 1-10 years, depending on your results and the type of screening test.  Diabetes screening. This is done by checking your blood sugar (glucose) after you have not eaten for a while (fasting). You may have this   done every 1-3 years.  Mammogram. This may be done every 1-2 years. Talk with your health care provider about when you should start having regular mammograms. This may depend on whether you have a family history of breast cancer.  BRCA-related cancer screening. This may be done if you have a family history of breast, ovarian, tubal, or peritoneal  cancers.  Pelvic exam and Pap test. This may be done every 3 years starting at age 21. Starting at age 48, this may be done every 5 years if you have a Pap test in combination with an HPV test. Other tests  Sexually transmitted disease (STD) testing.  Bone density scan. This is done to screen for osteoporosis. You may have this scan if you are at high risk for osteoporosis. Follow these instructions at home: Eating and drinking  Eat a diet that includes fresh fruits and vegetables, whole grains, lean protein, and low-fat dairy.  Take vitamin and mineral supplements as recommended by your health care provider.  Do not drink alcohol if: ? Your health care provider tells you not to drink. ? You are pregnant, may be pregnant, or are planning to become pregnant.  If you drink alcohol: ? Limit how much you have to 0-1 drink a day. ? Be aware of how much alcohol is in your drink. In the [place] one drink equals one 12 oz bottle of beer (355 mL), one 5 oz glass of wine (148 mL), or one 1 oz glass of hard liquor (44 mL). Lifestyle  Take daily care of your teeth and gums.  Stay active. Exercise for at least 30 minutes on 5 or more days each week.  Do not use any products that contain nicotine or tobacco, such as cigarettes, e-cigarettes, and chewing tobacco. If you need help quitting, ask your health care provider.  If you are sexually active, practice safe sex. Use a condom or other form of birth control (contraception) in order to prevent pregnancy and STIs (sexually transmitted infections).  If told by your health care provider, take low-dose aspirin daily starting at age 29. What's next?  Visit your health care provider once a year for a well check visit.  Ask your health care provider how often you should have your eyes and teeth checked.  Stay up to date on all vaccines. This information is not intended to replace advice given to you by your health care provider. Make sure you  discuss any questions you have with your health care provider. Document Revised: 10/05/2017 Document Reviewed: 10/05/2017 Elsevier Patient Education  2020 [employer] Breast self-awareness is knowing how your breasts look and feel. Doing breast self-awareness is important. It allows you to catch a breast problem early while it is still small and can be treated. All women should do breast self-awareness, including women who have had breast implants. Tell your doctor if you notice a change in your breasts. What you need:  A mirror.  A well-lit room. How to do a breast self-exam A breast self-exam is one way to learn what is normal for your breasts and to check for changes. To do a breast self-exam: Look for changes  1. Take off all the clothes above your waist. 2. Stand in front of a mirror in a room with good lighting. 3. Put your hands on your hips. 4. Push your hands down. 5. Look at your breasts and nipples in the mirror to see if one breast or nipple looks different from  the other. Check to see if: ? The shape of one breast is different. ? The size of one breast is different. ? There are wrinkles, dips, and bumps in one breast and not the other. 6. Look at each breast for changes in the skin, such as: ? Redness. ? Scaly areas. 7. Look for changes in your nipples, such as: ? Liquid around the nipples. ? Bleeding. ? Dimpling. ? Redness. ? A change in where the nipples are. Feel for changes  1. Lie on your back on the floor. 2. Feel each breast. To do this, follow these steps: ? Pick a breast to feel. ? Put the arm closest to that breast above your head. ? Use your other arm to feel the nipple area of your breast. Feel the area with the pads of your three middle fingers by making small circles with your fingers. For the first circle, press lightly. For the second circle, press harder. For the third circle, press even harder. ? Keep making circles with  your fingers at the different pressures as you move down your breast. Stop when you feel your ribs. ? Move your fingers a little toward the center of your body. ? Start making circles with your fingers again, this time going up until you reach your collarbone. ? Keep making up-and-down circles until you reach your armpit. Remember to keep using the three pressures. ? Feel the other breast in the same way. 3. Sit or stand in the tub or shower. 4. With soapy water on your skin, feel each breast the same way you did in step 2 when you were lying on the floor. Write down what you find Writing down what you find can help you remember what to tell your doctor. Write down:  What is normal for each breast.  Any changes you find in each breast, including: ? The kind of changes you find. ? Whether you have pain. ? Size and location of any lumps.  When you last had your menstrual period. General tips  Check your breasts every month.  If you are breastfeeding, the best time to check your breasts is after you feed your baby or after you use a breast pump.  If you get menstrual periods, the best time to check your breasts is 5-7 days after your menstrual period is over.  With time, you will become comfortable with the self-exam, and you will begin to know if there are changes in your breasts. Contact a doctor if you:  See a change in the shape or size of your breasts or nipples.  See a change in the skin of your breast or nipples, such as red or scaly skin.  Have fluid coming from your nipples that is not normal.  Find a lump or thick area that was not there before.  Have pain in your breasts.  Have any concerns about your breast health. Summary  Breast self-awareness includes looking for changes in your breasts, as well as feeling for changes within your breasts.  Breast self-awareness should be done in front of a mirror in a well-lit room.  You should check your breasts every month.  If you get menstrual periods, the best time to check your breasts is 5-7 days after your menstrual period is over.  Let your doctor know of any changes you see in your breasts, including changes in size, changes on the skin, pain or tenderness, or fluid from your nipples that is not normal. This information is  not intended to replace advice given to you by your health care provider. Make sure you discuss any questions you have with your health care provider. Document Revised: 09/12/2017 Document Reviewed: 09/12/2017 Elsevier Patient Education  [place].

## 2020-01-08 ENCOUNTER — Other Ambulatory Visit: Payer: Self-pay

## 2020-01-08 ENCOUNTER — Encounter: Payer: Self-pay | Admitting: Obstetrics and Gynecology

## 2020-01-08 ENCOUNTER — Ambulatory Visit (INDEPENDENT_AMBULATORY_CARE_PROVIDER_SITE_OTHER): Payer: Managed Care, Other (non HMO) | Admitting: Obstetrics and Gynecology

## 2020-01-08 VITALS — BP 115/79 | HR 70 | Ht 65.0 in | Wt 226.7 lb

## 2020-01-08 DIAGNOSIS — Z800 Family history of malignant neoplasm of digestive organs: Secondary | ICD-10-CM | POA: Diagnosis not present

## 2020-01-08 DIAGNOSIS — Z6837 Body mass index (BMI) 37.0-37.9, adult: Secondary | ICD-10-CM

## 2020-01-08 DIAGNOSIS — Z1231 Encounter for screening mammogram for malignant neoplasm of breast: Secondary | ICD-10-CM

## 2020-01-08 DIAGNOSIS — Z01419 Encounter for gynecological examination (general) (routine) without abnormal findings: Secondary | ICD-10-CM

## 2020-01-08 DIAGNOSIS — R102 Pelvic and perineal pain unspecified side: Secondary | ICD-10-CM

## 2020-01-08 DIAGNOSIS — E669 Obesity, unspecified: Secondary | ICD-10-CM

## 2020-01-08 DIAGNOSIS — K219 Gastro-esophageal reflux disease without esophagitis: Secondary | ICD-10-CM | POA: Insufficient documentation

## 2020-01-08 DIAGNOSIS — I10 Essential (primary) hypertension: Secondary | ICD-10-CM | POA: Insufficient documentation

## 2020-01-08 DIAGNOSIS — N8110 Cystocele, unspecified: Secondary | ICD-10-CM

## 2020-01-08 DIAGNOSIS — F429 Obsessive-compulsive disorder, unspecified: Secondary | ICD-10-CM | POA: Insufficient documentation

## 2020-01-08 DIAGNOSIS — R1310 Dysphagia, unspecified: Secondary | ICD-10-CM | POA: Insufficient documentation

## 2020-01-08 DIAGNOSIS — M797 Fibromyalgia: Secondary | ICD-10-CM | POA: Insufficient documentation

## 2020-01-08 DIAGNOSIS — H409 Unspecified glaucoma: Secondary | ICD-10-CM | POA: Insufficient documentation

## 2020-01-08 NOTE — Progress Notes (Signed)
ANNUAL PREVENTATIVE CARE GYNECOLOGY  ENCOUNTER NOTE  Subjective:       Sydney Soto is a 59 y.o. G23P2002 female here for a routine annual gynecologic exam. The patient is sexually active. The patient is not taking hormone replacement therapy. Patient denies post-menopausal vaginal bleeding. The patient wears seatbelts: yes. Does the patient participates in regular exercise: no. Has the patient ever been transfused or tattooed?: no.   Current complaints: 1.  Reports that pelvic pain she was having last month is improving. Still having an occasional episode here and there, but not as intense as at initial presentation. Thinks it may be more urethral (as she feels bladder pressure), but denies dysuria, hematuria, or frequency. Previously worked up with negative ultrasound and UA.    Gynecologic History No LMP recorded. Patient has had a hysterectomy. Contraception: status post hysterectomy Last Pap: 2017. Results were: normal.  Last mammogram: 08/16/2019. Results were: normal.   Last Colonoscopy: 11/2019. Results were: normal.  Gets screened q 5 years due to family history of colon cancer.  Last Dexa Scan: None   Obstetric History OB History  Gravida Para Term Preterm AB Living  2 2 2     2   SAB TAB Ectopic Multiple Live Births          2    # Outcome Date GA Lbr Len/2nd Weight Sex Delivery Anes PTL Lv  2 Term 1997    F Vag-Spont   LIV  1 Term 81    F Vag-Spont   LIV    Past Medical History:  Diagnosis Date  . Anxiety   . Dysphagia, unspecified   . GERD (gastroesophageal reflux disease)   . Glaucoma   . History of IBS    WITH CONSTIPATION  . Hyperlipidemia   . Hypertension   . Hypothyroidism   . OCD (obsessive compulsive disorder)   . Pre-diabetes    DIET CONTROLLED  . Pulmonary embolism (HCC)    Bilateral   . Thyroid disease     Family History  Problem Relation Age of Onset  . Osteoarthritis Mother   . Colon cancer Mother   . Osteoarthritis Father   .  Diabetes Father   . Thyroid disease Sister   . Diabetes Brother   . Breast cancer Neg Hx   . Ovarian cancer Neg Hx     Past Surgical History:  Procedure Laterality Date  . ABDOMINAL HYSTERECTOMY    . APPENDECTOMY    . BACK SURGERY  09/06/2019  . CHOLECYSTECTOMY    . COLONOSCOPY N/A 07/02/2014   Procedure: COLONOSCOPY;  Surgeon: 07/04/2014, MD;  Location: [facility] ENDOSCOPY;  Service: Endoscopy;  Laterality: N/A;  . COLONOSCOPY WITH PROPOFOL N/A 11/25/2019   Procedure: COLONOSCOPY WITH PROPOFOL;  Surgeon: Toledo, 11/27/2019, MD;  Location: [facility] ENDOSCOPY;  Service: Endoscopy;  Laterality: N/A;  . DILATION AND CURETTAGE OF UTERUS    . ESOPHAGOGASTRODUODENOSCOPY N/A 07/02/2014   Procedure: ESOPHAGOGASTRODUODENOSCOPY (EGD);  Surgeon: 07/04/2014, MD;  Location: [facility] ENDOSCOPY;  Service: Endoscopy;  Laterality: N/A;  . TUBAL LIGATION      Social History   Socioeconomic History  . Marital status: Married    Spouse name: Not on file  . Number of children: Not on file  . Years of education: Not on file  . Highest education level: Not on file  Occupational History  . Not on file  Tobacco Use  . Smoking status: Never Smoker  . Smokeless tobacco: Never Used  Vaping Use  . Vaping Use: Never used  Substance and Sexual Activity  . Alcohol use: No  . Drug use: No  . Sexual activity: Not Currently    Birth control/protection: Surgical  Other Topics Concern  . Not on file  Social History Narrative  . Not on file   Social Determinants of Health   Financial Resource Strain:   . Difficulty of Paying Living Expenses: Not on file  Food Insecurity:   . Worried About Programme researcher, broadcasting/film/video in the Last Year: Not on file  . Ran Out of Food in the Last Year: Not on file  Transportation Needs:   . Lack of Transportation (Medical): Not on file  . Lack of Transportation (Non-Medical): Not on file  Physical Activity:   . Days of Exercise per Week: Not on file  . Minutes of Exercise per  Session: Not on file  Stress:   . Feeling of Stress : Not on file  Social Connections:   . Frequency of Communication with Friends and Family: Not on file  . Frequency of Social Gatherings with Friends and Family: Not on file  . Attends Religious Services: Not on file  . Active Member of Clubs or Organizations: Not on file  . Attends Banker Meetings: Not on file  . Marital Status: Not on file  Intimate Partner Violence:   . Fear of Current or Ex-Partner: Not on file  . Emotionally Abused: Not on file  . Physically Abused: Not on file  . Sexually Abused: Not on file    Current Outpatient Medications on File Prior to Visit  Medication Sig Dispense Refill  . aspirin 325 MG tablet Take 325 mg by mouth daily.    . bisoprolol-hydrochlorothiazide (ZIAC) 5-6.25 MG tablet Take 1 tablet by mouth daily.    Marland Kitchen buPROPion (WELLBUTRIN SR) 150 MG 12 hr tablet TK 1 T PO 2 TIMES D  3  . cyanocobalamin (,VITAMIN B-12,) 1000 MCG/ML injection Inject 1 mL into the muscle every 30 (thirty) days.    . DULoxetine (CYMBALTA) 30 MG capsule Take 30-60 mg by mouth 2 (two) times daily. 2 cap in the morning and 1 cap at night    . ferrous sulfate 324 MG TBEC Take 324 mg by mouth daily with breakfast.    . levothyroxine (SYNTHROID) 50 MCG tablet Take 50 mcg by mouth daily before breakfast.    . magnesium oxide (MAG-OX) 400 MG tablet Take 400 mg by mouth daily.     No current facility-administered medications on file prior to visit.    Allergies  Allergen Reactions  . Erythromycin Nausea Only    Review of Systems ROS Review of Systems - General ROS: negative for - chills, fatigue, fever, hot flashes, night sweats, weight gain.    Psychological ROS: negative for - anxiety, decreased libido, depression, mood swings, physical abuse or sexual abuse Ophthalmic ROS: negative for - blurry vision, eye pain or loss of vision ENT ROS: negative for - headaches, hearing change, visual changes or vocal  changes Allergy and Immunology ROS: negative for - hives, itchy/watery eyes or seasonal allergies Hematological and Lymphatic ROS: negative for - bleeding problems, bruising, swollen lymph nodes or weight loss Endocrine ROS: negative for - galactorrhea, hair pattern changes, hot flashes, malaise/lethargy, mood swings, palpitations, polydipsia/polyuria, skin changes, temperature intolerance or unexpected weight changes Breast ROS: negative for - new or changing breast lumps or nipple discharge Respiratory ROS: negative for - cough or shortness of breath Cardiovascular  ROS: negative for - chest pain, irregular heartbeat, palpitations or shortness of breath Gastrointestinal ROS: no abdominal pain, change in bowel habits, or black or bloody stools Genito-Urinary ROS: no dysuria, trouble voiding, or hematuria. Positive for pelvic pain (intermittent, improving. See HPI).   Musculoskeletal ROS: negative for - joint pain or joint stiffness Neurological ROS: negative for - bowel and bladder control changes Dermatological ROS: negative for rash and skin changes.     Objective:   BP 115/79   Pulse 70   Ht 5\' 5"  (1.651 m)   Wt 226 lb 11.2 oz (102.8 kg)   BMI 37.72 kg/m  CONSTITUTIONAL: Well-developed, well-nourished female in no acute distress. Moderate obesity PSYCHIATRIC: Normal mood and affect. Normal behavior. Normal judgment and thought content. NEUROLGIC: Alert and oriented to person, place, and time. Normal muscle tone coordination. No cranial nerve deficit noted. HENT:  Normocephalic, atraumatic, External right and left ear normal. Oropharynx is clear and moist EYES: Conjunctivae and EOM are normal. Pupils are equal, round, and reactive to light. No scleral icterus.  NECK: Normal range of motion, supple, no masses.  Normal thyroid.  SKIN: Skin is warm and dry. No rash noted. Not diaphoretic. No erythema. No pallor. CARDIOVASCULAR: Normal heart rate noted, regular rhythm, no murmur.     RESPIRATORY: Small cystocele present.Clear to auscultation bilaterally. Effort and breath sounds normal, no problems with respiration noted. BREASTS: Symmetric in size. No masses, skin changes, nipple drainage, or lymphadenopathy. ABDOMEN: Soft, normal bowel sounds, no distention noted.  No tenderness, rebound or guarding.  BLADDER: Normal PELVIC:  Bladder no bladder distension noted  Urethra: normal appearing urethra with no masses, tenderness or lesions  Vulva: normal without lesions  Vagina: normal appearing vagina with normal color and scant thin white discharge, no lesions.    Cervix: surgically absent  Uterus: surgically absent, vaginal cuff well healed.   Adnexa: normal adnexa in size, nontender and no masses.   RV: External Exam NormaI, No Rectal Masses and Normal Sphincter tone  MUSCULOSKELETAL: Normal range of motion. No tenderness.  No cyanosis, clubbing, or edema.  2+ distal pulses. LYMPHATIC: No Axillary, Supraclavicular, or Inguinal Adenopathy.   Labs:   Labs reviewed in Care Everywhere  Assessment:   Annual gynecologic examination 59 y.o. Obesity Class 2 Breast cancer screening Family h/o colon cancer Grade 1 cystocele Pelvic pain  Plan:  - Pap: Not needed.  History of hysterectomy.  Was still being screened yearly by her previous GYN. Informed last year that no further paps were indicated.  - Mammogram: Up to date and Ordered for next year. - Stool Guaiac Testing:  Not Ordered. Colonoscopy up to date. To repeat every 5 years due to family h/o colon cancer.  - Labs: None.  Recent labs reviewed in Care Everywhere, performed by PCP.  - Routine preventative health maintenance measures emphasized: Exercise/Diet/Weight control, Alcohol/Substance use risks and Stress Management.  Encourage Vitamin D and calcium supplementation for prevention of osteoporosis.  - Grade 1 cystocele otherwise asymptomatic. No interventions needed currently.  - Pelvic pain slowly improving,  still episodic, but not as intense. Discussed other etiologies including urinary source, scar tissue from prior pelvic surgeries.To follow up if pain persists or worsens.  -  COVID vaccination: declined.  - Declines flu vaccine. Return to Clinic - 1 Year   59, MD [facility]

## 2020-07-16 ENCOUNTER — Other Ambulatory Visit: Payer: Self-pay | Admitting: Internal Medicine

## 2020-07-16 DIAGNOSIS — Z1231 Encounter for screening mammogram for malignant neoplasm of breast: Secondary | ICD-10-CM

## 2020-08-24 ENCOUNTER — Ambulatory Visit
Admission: RE | Admit: 2020-08-24 | Discharge: 2020-08-24 | Disposition: A | Discharge status: Home / Self Care | Payer: Managed Care, Other (non HMO) | Source: Ambulatory Visit | Attending: Internal Medicine | Admitting: Internal Medicine

## 2020-08-24 ENCOUNTER — Other Ambulatory Visit: Payer: Self-pay

## 2020-08-24 DIAGNOSIS — Z1231 Encounter for screening mammogram for malignant neoplasm of breast: Secondary | ICD-10-CM | POA: Diagnosis present

## 2020-10-15 ENCOUNTER — Other Ambulatory Visit: Payer: Self-pay

## 2020-10-15 ENCOUNTER — Encounter: Payer: Managed Care, Other (non HMO) | Attending: Physician Assistant | Admitting: Physician Assistant

## 2020-10-15 DIAGNOSIS — L02211 Cutaneous abscess of abdominal wall: Secondary | ICD-10-CM | POA: Insufficient documentation

## 2020-10-15 DIAGNOSIS — L98492 Non-pressure chronic ulcer of skin of other sites with fat layer exposed: Secondary | ICD-10-CM | POA: Insufficient documentation

## 2020-10-15 DIAGNOSIS — E11622 Type 2 diabetes mellitus with other skin ulcer: Secondary | ICD-10-CM | POA: Diagnosis present

## 2020-10-15 DIAGNOSIS — E1151 Type 2 diabetes mellitus with diabetic peripheral angiopathy without gangrene: Secondary | ICD-10-CM | POA: Diagnosis not present

## 2020-10-21 NOTE — Progress Notes (Signed)
Sydney Soto, Sydney Soto ([id number]) Visit Report for 10/15/2020 Chief Complaint Document Details Patient Name: Sydney Soto, Sydney Soto. Date of Service: 10/15/2020 8:45 AM Medical Record Number: [id number] Patient Account Number: [id number] Date of Birth/Sex: Mar 14, 1960 (60 y.o. F) Treating RN: Sydney Soto Primary Care Provider: Fulton Soto Other Clinician: Referring Provider: Herbert Soto Treating Provider/Extender: Sydney Soto in Treatment: 0 Information Obtained from: Patient Chief Complaint Abdominal surgical/abscess ulcer Electronic Signature(s) Signed: 10/15/2020 9:21:52 AM By: Sydney Keeler PA-C Entered By: Sydney Soto on 10/15/2020 09:21:52 Sydney Soto ([id number]) -------------------------------------------------------------------------------- HPI Details Patient Name: Sydney Soto Date of Service: 10/15/2020 8:45 AM Medical Record Number: [id number] Patient Account Number: [id number] Date of Birth/Sex: 03-25-1960 (60 y.o. F) Treating RN: Sydney Soto Primary Care Provider: Fulton Soto Other Clinician: Referring Provider: Herbert Soto Treating Provider/Extender: Sydney Soto in Treatment: 0 History of Present Illness HPI Description: 10/15/2020 upon evaluation today patient appears to be doing somewhat poorly in regard to a wound that she noted began in May she had an abscess in lower midline abdominal area in the suprapubic region. Subsequently she tells me that during that time she ended up having to have surgery and this in fact was twice to try to clean the area out. Following the surgery the last time she developed a hematoma and that is what were dealing with more so than anything. She is actually made some good progress she tells me over the past 2 weeks to the point that she almost did not come today. She wants to try to stretch out her visits to every 2 weeks. With that being said she does have a history  of diabetes mellitus type 2 as well as hypertension she is on medication for both. Electronic Signature(s) Signed: 10/15/2020 10:21:20 AM By: Sydney Keeler PA-C Entered By: Sydney Soto on 10/15/2020 10:21:20 Sydney Soto ([id number]) -------------------------------------------------------------------------------- Physical Exam Details Patient Name: Sydney Soto Date of Service: 10/15/2020 8:45 AM Medical Record Number: [id number] Patient Account Number: [id number] Date of Birth/Sex: 08-26-1960 (60 y.o. F) Treating RN: Sydney Soto Primary Care Provider: Fulton Soto Other Clinician: Referring Provider: Herbert Soto Treating Provider/Extender: Sydney Soto in Treatment: 0 Constitutional sitting or standing blood pressure is within target range for patient.. pulse regular and within target range for patient.Marland Kitchen respirations regular, non- labored and within target range for patient.Marland Kitchen temperature within target range for patient.. Well-nourished and well-hydrated in no acute distress. Eyes conjunctiva clear no eyelid edema noted. pupils equal round and reactive to light and accommodation. Ears, Nose, Mouth, and Throat no gross abnormality of ear auricles or external auditory canals. normal hearing noted during conversation. mucus membranes moist. Respiratory normal breathing without difficulty. Cardiovascular no clubbing, cyanosis, significant edema, <3 sec cap refill. Musculoskeletal normal gait and posture. no significant deformity or arthritic changes, no loss or range of motion, no clubbing. Psychiatric this patient is able to make decisions and demonstrates good insight into disease process. Alert and Oriented x 3. pleasant and cooperative. Notes Upon inspection patient's wound bed actually showed signs of fairly good granulation and epithelization at this point. She did have a tunnel at 9:00 and roughly 4:00 that needs to be addressed here other  than that however she seems to overall be doing quite well. I am extremely pleased with where things stand currently. Electronic Signature(s) Signed: 10/15/2020 10:22:07 AM By: Sydney Keeler PA-C Entered By: Sydney Soto on 10/15/2020 10:22:07 Sydney Soto ([id number]) -------------------------------------------------------------------------------- Physician Orders Details Patient Name: [name] Date of Service: 10/15/2020 8:45 AM Medical Record Number: [id number] Patient Account Number: [id number] Date of Birth/Sex: 1960-04-21 (60 y.o. F) Treating RN: Sydney Soto Primary Care Provider: Fulton Soto Other Clinician: Referring Provider: Herbert Soto Treating Provider/Extender: Sydney Soto in Treatment: 0 Verbal / Phone Orders: No Diagnosis Coding ICD-10 Coding Code Description T81.31XA Disruption of external operation (surgical) wound, not elsewhere classified, initial encounter L98.492 Non-pressure chronic ulcer of skin of other sites with fat layer exposed E11.622 Type 2 diabetes mellitus with other skin ulcer I10 Essential (primary) hypertension Follow-up Appointments o Return Appointment in 2 weeks. Wound Treatment Wound #1 - Abdomen - Lower Quadrant Cleanser: Byram Ancillary Kit - 15 Day Supply (DME) (Generic) 3 x Per Week/30 Days Discharge Instructions: Use supplies as instructed; Kit contains: (15) Saline Bullets; (15) 3x3 Gauze; 15 pr Gloves Cleanser: Soap and Water 3 x Per Week/30 Days Discharge Instructions: Gently cleanse wound with antibacterial soap, rinse and pat dry prior to dressing wounds Primary Dressing: Foam Dressing, 4x4 (in/in) (DME) (Generic) 3 x Per Week/30 Days Primary Dressing: Endoform 2x2 (in/in) 3 x Per Week/30 Days Discharge Instructions: Apply Endoform as directed Secondary Dressing: Zetuvit Plus Silicone Border Dressing 5x5 (in/in) (DME) (Generic) 3 x Per Week/30 Days Notes please pack into tunnel at 9  oclock and 4 oclock Electronic Signature(s) Signed: 10/16/2020 11:43:44 AM By: Sydney Keeler PA-C Signed: 10/21/2020 7:54:12 AM By: Sydney Coria RN Entered By: Sydney Soto on 10/15/2020 09:49:00 Sydney Soto ([id number]) -------------------------------------------------------------------------------- Problem List Details Patient Name: Sydney Soto Date of Service: 10/15/2020 8:45 AM Medical Record Number: [id number] Patient Account Number: [id number] Date of Birth/Sex: 1960/09/11 (60 y.o. F) Treating RN: Sydney Soto Primary Care Provider: Fulton Soto Other Clinician: Referring Provider: Herbert Soto Treating Provider/Extender: Sydney Soto in Treatment: 0 Active Problems ICD-10 Encounter Code Description Active Date MDM Diagnosis T81.31XA Disruption of external operation (surgical) wound, not elsewhere 10/15/2020 No Yes classified, initial encounter L98.492 Non-pressure chronic ulcer of skin of other sites with fat layer exposed 10/15/2020 No Yes E11.622 Type 2 diabetes mellitus with other skin ulcer 10/15/2020 No Yes I10 Essential (primary) hypertension 10/15/2020 No Yes Inactive Problems Resolved Problems Electronic Signature(s) Signed: 10/15/2020 9:21:09 AM By: Sydney Keeler PA-C Entered By: Sydney Soto on 10/15/2020 09:21:09 Panos, Walker Soto ([id number]) -------------------------------------------------------------------------------- Progress Note Details Patient Name: Sydney Soto. Date of Service: 10/15/2020 8:45 AM Medical Record Number: [id number] Patient Account Number: [id number] Date of Birth/Sex: 1960-09-22 (60 y.o. F) Treating RN: Sydney Soto Primary Care Provider: Fulton Soto Other Clinician: Referring Provider: Herbert Soto Treating Provider/Extender: Sydney Soto in Treatment: 0 Subjective Chief Complaint Information obtained from Patient Abdominal surgical/abscess ulcer History of Present  Illness (HPI) 10/15/2020 upon evaluation today patient appears to be doing somewhat poorly in regard to a wound that she noted began in May she had an abscess in lower midline abdominal area in the suprapubic region. Subsequently she tells me that during that time she ended up having to have surgery and this in fact was twice to try to clean the area out. Following the surgery the last time she developed a hematoma and that is what were dealing with more so than anything. She is actually made some good progress she tells me over the past 2 weeks to the point that she almost did not come today. She wants to try to stretch out her visits to every 2 weeks. With that being said she does have a history of diabetes mellitus type 2  as well as hypertension she is on medication for both. Patient History Information obtained from Patient. Allergies erythromycin base, Qvar Social History Never smoker, Marital Status - Married, Alcohol Use - Never, Drug Use - No History, Caffeine Use - Rarely. Medical History Cardiovascular Patient has history of Hypertension Endocrine Patient has history of Type II Diabetes Patient is treated with Oral Agents. Blood sugar is not tested. Review of Systems (ROS) Integumentary (Skin) Complains or has symptoms of Wounds. Objective Constitutional sitting or standing blood pressure is within target range for patient.. pulse regular and within target range for patient.Marland Kitchen respirations regular, non- labored and within target range for patient.Marland Kitchen temperature within target range for patient.. Well-nourished and well-hydrated in no acute distress. Vitals Time Taken: 9:00 AM, Height: 65 in, Source: Stated, Weight: 220 lbs, Source: Stated, BMI: 36.6, Temperature: 98.1 F, Pulse: 76 bpm, Respiratory Rate: 18 breaths/min, Blood Pressure: 127/79 mmHg. Eyes conjunctiva clear no eyelid edema noted. pupils equal round and reactive to light and accommodation. Ears, Nose, Mouth, and  Throat no gross abnormality of ear auricles or external auditory canals. normal hearing noted during conversation. mucus membranes moist. Respiratory normal breathing without difficulty. Cardiovascular Lipschutz, Marrian H. ([id number]) no clubbing, cyanosis, significant edema, Musculoskeletal normal gait and posture. no significant deformity or arthritic changes, no loss or range of motion, no clubbing. Psychiatric this patient is able to make decisions and demonstrates good insight into disease process. Alert and Oriented x 3. pleasant and cooperative. General Notes: Upon inspection patient's wound bed actually showed signs of fairly good granulation and epithelization at this point. She did have a tunnel at 9:00 and roughly 4:00 that needs to be addressed here other than that however she seems to overall be doing quite well. I am extremely pleased with where things stand currently. Integumentary (Hair, Skin) Wound #1 status is Open. Original cause of wound was Surgical Injury. The date acquired was: 08/18/2020. The wound is located on the Abdomen - Lower Quadrant. The wound measures 3.5cm length x 0.4cm width x 0.1cm depth; 1.1cm^2 area and 0.11cm^3 volume. There is Fat Layer (Subcutaneous Tissue) exposed. There is no undermining noted, however, there is tunneling at 9:00 with a maximum distance of 1.3cm. There is additional tunneling and at 4:00 with a maximum distance of 0.6cm. There is a medium amount of serosanguineous drainage noted. There is large (67-100%) red granulation within the wound bed. There is no necrotic tissue within the wound bed. Assessment Active Problems ICD-10 Disruption of external operation (surgical) wound, not elsewhere classified, initial encounter Non-pressure chronic ulcer of skin of other sites with fat layer exposed Type 2 diabetes mellitus with other skin ulcer Essential (primary) hypertension Plan Follow-up Appointments: Return Appointment in 2  weeks. General Notes: please pack into tunnel at 9 oclock and 4 oclock WOUND #1: - Abdomen - Lower Quadrant Wound Laterality: Cleanser: Byram Ancillary Kit - 15 Day Supply (DME) (Generic) 3 x Per Week/30 Days Discharge Instructions: Use supplies as instructed; Kit contains: (15) Saline Bullets; (15) 3x3 Gauze; 15 pr Gloves Cleanser: Soap and Water 3 x Per Week/30 Days Discharge Instructions: Gently cleanse wound with antibacterial soap, rinse and pat dry prior to dressing wounds Primary Dressing: Foam Dressing, 4x4 (in/in) (DME) (Generic) 3 x Per Week/30 Days Primary Dressing: Endoform 2x2 (in/in) 3 x Per Week/30 Days Discharge Instructions: Apply Endoform as directed Secondary Dressing: Zetuvit Plus Silicone Border Dressing 5x5 (in/in) (DME) (Generic) 3 x Per Week/30 Days 1. Would recommend currently based on the fact that we do  have some small tunnel areas I think endoform would be a good option for his ear. This is something we can actually packed into the tunnel without it being too tight and allowing for granulation and epithelization to occur as well. 2. Also can recommend that we have the patient continue with the piece of foam behind act as a bolster and keep things moist so that it will not dry out too much. 3. I am also can recommend that we continue rather initiate treatment with a border foam dressing to cover which I think will also be helpful for the patient. We will see patient back for reevaluation in 1 week here in the clinic. If anything worsens or changes patient will contact our office for additional recommendations. Electronic Signature(s) Signed: 10/15/2020 10:23:00 AM By: Sydney Keeler PA-C Entered By: Sydney Soto on 10/15/2020 10:23:00 Sydney Soto ([id number]) -------------------------------------------------------------------------------- ROS/PFSH Details Patient Name: Sydney Soto Date of Service: 10/15/2020 8:45 AM Medical Record Number:  [id number] Patient Account Number: [id number] Date of Birth/Sex: March 31, 1960 (60 y.o. F) Treating RN: Sydney Soto Primary Care Provider: Fulton Soto Other Clinician: Referring Provider: Herbert Soto Treating Provider/Extender: Sydney Soto in Treatment: 0 Information Obtained From Patient Integumentary (Skin) Complaints and Symptoms: Positive for: Wounds Cardiovascular Medical History: Positive for: Hypertension Endocrine Medical History: Positive for: Type II Diabetes Time with diabetes: 6 months Treated with: Oral agents Blood sugar tested every day: No Immunizations Pneumococcal Vaccine: Received Pneumococcal Vaccination: No Implantable Devices None Family and Social History Never smoker; Marital Status - Married; Alcohol Use: Never; Drug Use: No History; Caffeine Use: Rarely; Financial Concerns: No; Food, Clothing or Shelter Needs: No; Support System Lacking: No; Transportation Concerns: No Electronic Signature(s) Signed: 10/16/2020 11:43:44 AM By: Sydney Keeler PA-C Signed: 10/21/2020 7:54:12 AM By: Sydney Coria RN Entered By: Sydney Soto on 10/15/2020 09:05:41 Schertzer, Walker Soto ([id number]) -------------------------------------------------------------------------------- SuperBill Details Patient Name: Sydney Soto Date of Service: 10/15/2020 Medical Record Number: [id number] Patient Account Number: [id number] Date of Birth/Sex: November 12, 1960 (60 y.o. F) Treating RN: Sydney Soto Primary Care Provider: Fulton Soto Other Clinician: Referring Provider: Herbert Soto Treating Provider/Extender: Sydney Soto in Treatment: 0 Diagnosis Coding ICD-10 Codes Code Description T81.31XA Disruption of external operation (surgical) wound, not elsewhere classified, initial encounter L98.492 Non-pressure chronic ulcer of skin of other sites with fat layer exposed E11.622 Type 2 diabetes mellitus with other skin ulcer I10 Essential  (primary) hypertension Facility Procedures CPT4 Code: 45038882 Description: 99213 - WOUND CARE VISIT-LEV 3 EST PT Modifier: Quantity: 1 Physician Procedures CPT4 Code: 8003491 Description: 79150 - WC PHYS LEVEL 4 - NEW PT Modifier: Quantity: 1 CPT4 Code: Description: ICD-10 Diagnosis Description T81.31XA Disruption of external operation (surgical) wound, not elsewhere classifi L98.492 Non-pressure chronic ulcer of skin of other sites with fat layer exposed E11.622 Type 2 diabetes mellitus with other skin  ulcer I10 Essential (primary) hypertension Modifier: ed, initial encounter Quantity: Electronic Signature(s) Signed: 10/15/2020 10:23:56 AM By: Sydney Keeler PA-C Previous Signature: 10/15/2020 9:54:29 AM Version By: Sydney Coria RN Entered By: Sydney Soto on 10/15/2020 10:23:56

## 2020-10-21 NOTE — Progress Notes (Signed)
TORSHA, LEMUS ([id number]) Visit Report for 10/15/2020 Abuse/Suicide Risk Screen Details Patient Name: Sydney Soto, Sydney Soto. Date of Service: 10/15/2020 8:45 AM Medical Record Number: [id number] Patient Account Number: [id number] Date of Birth/Sex: 08-22-1960 (60 y.o. F) Treating RN: Yevonne Pax Primary Care Dionicio Shelnutt: Aram Beecham Other Clinician: Referring Suhayb Anzalone: Carolan Shiver Treating Lissandra Keil/Extender: Rowan Blase in Treatment: 0 Abuse/Suicide Risk Screen Items Answer ABUSE RISK SCREEN: Has anyone close to you tried to hurt or harm you recentlyo No Do you feel uncomfortable with anyone in your familyo No Has anyone forced you do things that you didnot want to doo No Electronic Signature(s) Signed: 10/21/2020 7:54:12 AM By: Yevonne Pax RN Entered By: Yevonne Pax on 10/15/2020 09:05:57 Sydney Soto, Sydney Soto ([id number]) -------------------------------------------------------------------------------- Activities of Daily Living Details Patient Name: Sydney Soto. Date of Service: 10/15/2020 8:45 AM Medical Record Number: [id number] Patient Account Number: [id number] Date of Birth/Sex: [birth year] (60 y.o. F) Treating RN: Yevonne Pax Primary Care Miaya Lafontant: Aram Beecham Other Clinician: Referring Sharry Beining: Carolan Shiver Treating Stevan Eberwein/Extender: Rowan Blase in Treatment: 0 Activities of Daily Living Items Answer Activities of Daily Living (Please select one for each item) Drive Automobile Completely Able Take Medications Completely Able Use Telephone Completely Able Care for Appearance Completely Able Use Toilet Completely Able Bath / Shower Completely Able Dress Self Completely Able Feed Self Completely Able Walk Completely Able Get In / Out Bed Completely Able Housework Completely Able Prepare Meals Completely Able Handle Money Completely Able Shop for Self Completely Able Electronic Signature(s) Signed: 10/21/2020  7:54:12 AM By: Yevonne Pax RN Entered By: Yevonne Pax on 10/15/2020 09:06:22 Sydney Soto ([id number]) -------------------------------------------------------------------------------- Education Screening Details Patient Name: Sydney Soto Date of Service: 10/15/2020 8:45 AM Medical Record Number: [id number] Patient Account Number: [id number] Date of Birth/Sex: 06-20-1960 (60 y.o. F) Treating RN: Yevonne Pax Primary Care Agustina Witzke: Aram Beecham Other Clinician: Referring Schneider Warchol: Carolan Shiver Treating Brenna Friesenhahn/Extender: Rowan Blase in Treatment: 0 Primary Learner Assessed: Patient Learning Preferences/Education Level/Primary Language Learning Preference: Explanation Highest Education Level: College or Above Preferred Language: English Cognitive Barrier Language Barrier: No Translator Needed: No Memory Deficit: No Emotional Barrier: No Cultural/Religious Beliefs Affecting Medical Care: No Physical Barrier Impaired Vision: No Impaired Hearing: No Decreased Hand dexterity: No Knowledge/Comprehension Knowledge Level: Medium Comprehension Level: High Ability to understand written instructions: High Ability to understand verbal instructions: High Motivation Anxiety Level: Anxious Cooperation: Cooperative Education Importance: Acknowledges Need Interest in Health Problems: Asks Questions Perception: Coherent Willingness to Engage in Self-Management High Activities: Readiness to Engage in Self-Management High Activities: Electronic Signature(s) Signed: 10/21/2020 7:54:12 AM By: Yevonne Pax RN Entered By: Yevonne Pax on 10/15/2020 09:07:39 Sydney Soto ([id number]) -------------------------------------------------------------------------------- Fall Risk Assessment Details Patient Name: Sydney Soto. Date of Service: 10/15/2020 8:45 AM Medical Record Number: [id number] Patient Account Number: [id number] Date of  Birth/Sex: 26-Dec-1960 (60 y.o. F) Treating RN: Yevonne Pax Primary Care Antonetta Clanton: Aram Beecham Other Clinician: Referring Maimuna Leaman: Carolan Shiver Treating Babygirl Trager/Extender: Rowan Blase in Treatment: 0 Fall Risk Assessment Items Have you had 2 or more falls in the last 12 monthso 0 No Have you had any fall that resulted in injury in the last 12 monthso 0 No FALLS RISK SCREEN History of falling - immediate or within 3 months 0 No Secondary diagnosis (Do you have 2 or more medical diagnoseso) 0 No Ambulatory aid None/bed rest/wheelchair/nurse 0 No Crutches/cane/walker 0 No Furniture 0 No Intravenous therapy Access/Saline/Heparin Lock 0 No Gait/Transferring Normal/ bed rest/ wheelchair 0 No Weak (short steps with  or without shuffle, stooped but able to lift head while walking, may 0 No seek support from furniture) Impaired (short steps with shuffle, may have difficulty arising from chair, head down, impaired 0 No balance) Mental Status Oriented to own ability 0 No Electronic Signature(s) Signed: 10/21/2020 7:54:12 AM By: Yevonne Pax RN Entered By: Yevonne Pax on 10/15/2020 09:08:04 Sydney Soto, Sydney Soto ([id number]) -------------------------------------------------------------------------------- Foot Assessment Details Patient Name: Sydney Soto Date of Service: 10/15/2020 8:45 AM Medical Record Number: [id number] Patient Account Number: [id number] Date of Birth/Sex: [birth year] (60 y.o. F) Treating RN: Yevonne Pax Primary Care Nailah Luepke: Aram Beecham Other Clinician: Referring Courtni Balash: Carolan Shiver Treating Ramsey Midgett/Extender: Rowan Blase in Treatment: 0 Foot Assessment Items Site Locations + = Sensation present, - = Sensation absent, C = Callus, U = Ulcer R = Redness, W = Warmth, M = Maceration, PU = Pre-ulcerative lesion F = Fissure, S = Swelling, D = Dryness Assessment Right: Left: Other Deformity: No No Prior Foot Ulcer:  No No Prior Amputation: No No Charcot Joint: No No Ambulatory Status: Ambulatory Without Help Gait: Steady Electronic Signature(s) Signed: 10/21/2020 7:54:12 AM By: Yevonne Pax RN Entered By: Yevonne Pax on 10/15/2020 09:08:30 Sydney Soto, Sydney Soto ([id number]) -------------------------------------------------------------------------------- Nutrition Risk Screening Details Patient Name: Sydney Soto. Date of Service: 10/15/2020 8:45 AM Medical Record Number: [id number] Patient Account Number: [id number] Date of Birth/Sex: March 12, 1960 (60 y.o. F) Treating RN: Yevonne Pax Primary Care Kinza Gouveia: Aram Beecham Other Clinician: Referring Michio Thier: Carolan Shiver Treating Juancarlos Crescenzo/Extender: Rowan Blase in Treatment: 0 Height (in): 65 Weight (lbs): 220 Body Mass Index (BMI): 36.6 Nutrition Risk Screening Items Score Screening NUTRITION RISK SCREEN: I have an illness or condition that made me change the kind and/or amount of food I eat 0 No I eat fewer than two meals per day 0 No I eat few fruits and vegetables, or milk products 0 No I have three or more drinks of beer, liquor or wine almost every day 0 No I have tooth or mouth problems that make it hard for me to eat 0 No I don't always have enough money to buy the food I need 0 No I eat alone most of the time 0 No I take three or more different prescribed or over-the-counter drugs a day 1 Yes Without wanting to, I have lost or gained 10 pounds in the last six months 0 No I am not always physically able to shop, cook and/or feed myself 0 No Nutrition Protocols Good Risk Protocol 0 No interventions needed Moderate Risk Protocol High Risk Proctocol Risk Level: Good Risk Score: 1 Electronic Signature(s) Signed: 10/21/2020 7:54:12 AM By: Yevonne Pax RN Entered By: Yevonne Pax on 10/15/2020 09:08:19

## 2020-10-21 NOTE — Progress Notes (Signed)
VONNE, MCDANEL ([id number]) Visit Report for 10/15/2020 Allergy List Details Patient Name: Sydney Soto, Sydney Soto. Date of Service: 10/15/2020 8:45 AM Medical Record Number: [id number] Patient Account Number: [id number] Date of Birth/Sex: Apr 19, 1960 (60 y.o. F) Treating RN: Carlene Coria Primary Care Flora Parks: Fulton Reek Other Clinician: Referring Tenzin Pavon: Herbert Pun Treating Jimeka Balan/Extender: Jeri Cos Weeks in Treatment: 0 Allergies Active Allergies erythromycin base Qvar Allergy Notes Electronic Signature(s) Signed: 10/21/2020 7:54:12 AM By: Carlene Coria RN Entered By: Carlene Coria on 10/15/2020 09:02:52 Rivka Safer ([id number]) -------------------------------------------------------------------------------- Arrival Information Details Patient Name: Rivka Safer Date of Service: 10/15/2020 8:45 AM Medical Record Number: [id number] Patient Account Number: [id number] Date of Birth/Sex: 1960-12-23 (60 y.o. F) Treating RN: Carlene Coria Primary Care Rola Lennon: Fulton Reek Other Clinician: Referring Tyren Dugar: Herbert Pun Treating Ercell Razon/Extender: Skipper Cliche in Treatment: 0 Visit Information Patient Arrived: Ambulatory Arrival Time: 08:57 Accompanied By: self Transfer Assistance: None Patient Identification Verified: Yes Secondary Verification Process Completed: Yes Patient Requires Transmission-Based Precautions: No Patient Has Alerts: No Electronic Signature(s) Signed: 10/21/2020 7:54:12 AM By: Carlene Coria RN Entered By: Carlene Coria on 10/15/2020 08:58:07 Tetrault, [name] ([id number]) -------------------------------------------------------------------------------- Clinic Level of Care Assessment Details Patient Name: Rivka Safer Date of Service: 10/15/2020 8:45 AM Medical Record Number: [id number] Patient Account Number: [id number] Date of Birth/Sex: 1960-02-28 (60 y.o. F) Treating RN: Carlene Coria Primary Care Karyss Frese: Fulton Reek Other Clinician: Referring Gudelia Eugene: Herbert Pun Treating Freeda Spivey/Extender: Skipper Cliche in Treatment: 0 Clinic Level of Care Assessment Items TOOL 2 Quantity Score X - Use when only an EandM is performed on the INITIAL visit 1 0 ASSESSMENTS - Nursing Assessment / Reassessment X - General Physical Exam (combine w/ comprehensive assessment (listed just below) when performed on new 1 20 pt. evals) X- 1 25 Comprehensive Assessment (HX, ROS, Risk Assessments, Wounds Hx, etc.) ASSESSMENTS - Wound and Skin Assessment / Reassessment X - Simple Wound Assessment / Reassessment - one wound 1 5 []  - 0 Complex Wound Assessment / Reassessment - multiple wounds []  - 0 Dermatologic / Skin Assessment (not related to wound area) ASSESSMENTS - Ostomy and/or Continence Assessment and Care []  - Incontinence Assessment and Management 0 []  - 0 Ostomy Care Assessment and Management (repouching, etc.) PROCESS - Coordination of Care X - Simple Patient / Family Education for ongoing care 1 15 []  - 0 Complex (extensive) Patient / Family Education for ongoing care []  - 0 Staff obtains Programmer, systems, Records, Test Results / Process Orders []  - 0 Staff telephones HHA, Nursing Homes / Clarify orders / etc []  - 0 Routine Transfer to another Facility (non-emergent condition) []  - 0 Routine Hospital Admission (non-emergent condition) X- 1 15 New Admissions / Biomedical engineer / Ordering NPWT, Apligraf, etc. []  - 0 Emergency Hospital Admission (emergent condition) X- 1 10 Simple Discharge Coordination []  - 0 Complex (extensive) Discharge Coordination PROCESS - Special Needs []  - Pediatric / Minor Patient Management 0 []  - 0 Isolation Patient Management []  - 0 Hearing / Language / Visual special needs []  - 0 Assessment of Community assistance (transportation, D/C planning, etc.) []  - 0 Additional assistance / Altered mentation []  -  0 Support Surface(s) Assessment (bed, cushion, seat, etc.) INTERVENTIONS - Wound Cleansing / Measurement X - Wound Imaging (photographs - any number of wounds) 1 5 []  - 0 Wound Tracing (instead of photographs) X- 1 5 Simple Wound Measurement - one wound []  - 0 Complex Wound Measurement - multiple wounds Montellano, Kadajah H. ([id number]) X- 1 5 Simple Wound  Cleansing - one wound $RemoveB'[]'lZmjfXkx$  - 0 Complex Wound Cleansing - multiple wounds INTERVENTIONS - Wound Dressings X - Small Wound Dressing one or multiple wounds 1 10 $Re'[]'HTb$  - 0 Medium Wound Dressing one or multiple wounds $RemoveBeforeD'[]'RIBIOWjBmPlIJs$  - 0 Large Wound Dressing one or multiple wounds $RemoveBeforeD'[]'aQAmixhatXrbjT$  - 0 Application of Medications - injection INTERVENTIONS - Miscellaneous $RemoveBeforeD'[]'HwivzacqaLTDJC$  - External ear exam 0 $Remo'[]'aBWis$  - 0 Specimen Collection (cultures, biopsies, blood, body fluids, etc.) $RemoveBefor'[]'VFOrdTFLZeRN$  - 0 Specimen(s) / Culture(s) sent or taken to Lab for analysis $RemoveBefo'[]'mNoHqmxrqot$  - 0 Patient Transfer (multiple staff / Civil Service fast streamer / Similar devices) $RemoveBeforeDE'[]'oloZBxOQrfFOmgA$  - 0 Simple Staple / Suture removal (25 or less) $Remove'[]'EMbkuQi$  - 0 Complex Staple / Suture removal (26 or more) $Remove'[]'cgbpApj$  - 0 Hypo / Hyperglycemic Management (close monitor of Blood Glucose) $RemoveBefore'[]'ZSXVhTQMsimRS$  - 0 Ankle / Brachial Index (ABI) - do not check if billed separately Has the patient been seen at the hospital within the last three years: Yes Total Score: 115 Level Of Care: New/Established - Level 3 Electronic Signature(s) Signed: 10/21/2020 7:54:12 AM By: Carlene Coria RN Entered By: Carlene Coria on 10/15/2020 09:54:23 Blatter, [name] ([id number]) -------------------------------------------------------------------------------- Encounter Discharge Information Details Patient Name: Rivka Safer Date of Service: 10/15/2020 8:45 AM Medical Record Number: [id number] Patient Account Number: [id number] Date of Birth/Sex: Aug 30, 1960 (60 y.o. F) Treating RN: Carlene Coria Primary Care Yobany Vroom: Fulton Reek Other Clinician: Referring Summers Buendia: Herbert Pun Treating Annamary Buschman/Extender: Skipper Cliche in Treatment: 0 Encounter Discharge Information Items Discharge Condition: Stable Ambulatory Status: Ambulatory Discharge Destination: Home Transportation: Private Auto Accompanied By: self Schedule Follow-up Appointment: Yes Clinical Summary of Care: Patient Declined Electronic Signature(s) Signed: 10/15/2020 9:57:21 AM By: Carlene Coria RN Entered By: Carlene Coria on 10/15/2020 09:57:21 Carnell, [name] ([id number]) -------------------------------------------------------------------------------- Lower Extremity Assessment Details Patient Name: Rivka Safer Date of Service: 10/15/2020 8:45 AM Medical Record Number: [id number] Patient Account Number: [id number] Date of Birth/Sex: [birth year] (60 y.o. F) Treating RN: Carlene Coria Primary Care Laymond Postle: Fulton Reek Other Clinician: Referring Joycelin Radloff: Herbert Pun Treating Valene Villa/Extender: Jeri Cos Weeks in Treatment: 0 Electronic Signature(s) Signed: 10/21/2020 7:54:12 AM By: Carlene Coria RN Entered By: Carlene Coria on 10/15/2020 09:15:07 Tanguma, [name] ([id number]) -------------------------------------------------------------------------------- Multi Wound Chart Details Patient Name: Rivka Safer Date of Service: 10/15/2020 8:45 AM Medical Record Number: [id number] Patient Account Number: [id number] Date of Birth/Sex: [birth year] (60 y.o. F) Treating RN: Carlene Coria Primary Care Ason Heslin: Fulton Reek Other Clinician: Referring Corderro Koloski: Herbert Pun Treating Daylani Deblois/Extender: Skipper Cliche in Treatment: 0 Vital Signs Height(in): 65 Pulse(bpm): 60 Weight(lbs): 220 Blood Pressure(mmHg): 127/79 Body Mass Index(BMI): 37 Temperature(F): 98.1 Respiratory Rate(breaths/min): 18 Photos: [N/A:N/A] Wound Location: Abdomen - Lower Quadrant N/A N/A Wounding Event: Surgical Injury N/A N/A Primary Etiology: Open  Surgical Wound N/A N/A Comorbid History: Hypertension, Type II Diabetes N/A N/A Date Acquired: 08/18/2020 N/A N/A Weeks of Treatment: 0 N/A N/A Wound Status: Open N/A N/A Measurements L x W x D (cm) 3.5x0.4x0.1 N/A N/A Area (cm) : 1.1 N/A N/A Volume (cm) : 0.11 N/A N/A % Reduction in Area: 0.00% N/A N/A % Reduction in Volume: 0.00% N/A N/A Position 1 (o'clock): 9 Maximum Distance 1 (cm): 1.3 Position 2 (o'clock): 4 Maximum Distance 2 (cm): 0.6 Tunneling: Yes N/A N/A Classification: Full Thickness Without Exposed N/A N/A Support Structures Exudate Amount: Medium N/A N/A Exudate Type: Serosanguineous N/A N/A Exudate Color: red, brown N/A N/A Granulation Amount: Large (67-100%) N/A N/A Granulation Quality: Red N/A N/A Necrotic Amount: None Present (0%) N/A N/A Exposed  Structures: Fat Layer (Subcutaneous Tissue): N/A N/A Yes Fascia: No Tendon: No Muscle: No Joint: No Bone: No Epithelialization: None N/A N/A Treatment Notes Electronic Signature(s) Signed: 10/21/2020 7:54:12 AM By: Carlene Coria RN Entered By: Carlene Coria on 10/15/2020 09:52:29 Huegel, [name] ([id number]) Mckethan, [name] ([id number]) -------------------------------------------------------------------------------- [place] Details Patient Name: Rivka Safer Date of Service: 10/15/2020 8:45 AM Medical Record Number: [id number] Patient Account Number: [id number] Date of Birth/Sex: Oct 10, 1960 (60 y.o. F) Treating RN: Carlene Coria Primary Care Doristine Shehan: Fulton Reek Other Clinician: Referring Shonya Sumida: Herbert Pun Treating Gaston Dase/Extender: Skipper Cliche in Treatment: 0 Active Inactive Wound/Skin Impairment Nursing Diagnoses: Knowledge deficit related to ulceration/compromised skin integrity Goals: Patient/caregiver will verbalize understanding of skin care regimen Date Initiated: 10/15/2020 Target Resolution Date: 11/14/2020 Goal Status:  Active Ulcer/skin breakdown will have a volume reduction of 30% by week 4 Date Initiated: 10/15/2020 Target Resolution Date: 11/14/2020 Goal Status: Active Ulcer/skin breakdown will have a volume reduction of 50% by week 8 Date Initiated: 10/15/2020 Target Resolution Date: 12/16/2020 Goal Status: Active Ulcer/skin breakdown will have a volume reduction of 80% by week 12 Date Initiated: 10/15/2020 Target Resolution Date: 01/24/2021 Goal Status: Active Ulcer/skin breakdown will heal within 14 weeks Date Initiated: 10/15/2020 Target Resolution Date: 02/14/2021 Goal Status: Active Interventions: Assess patient/caregiver ability to obtain necessary supplies Assess patient/caregiver ability to perform ulcer/skin care regimen upon admission and as needed Assess ulceration(s) every visit Notes: Electronic Signature(s) Signed: 10/21/2020 7:54:12 AM By: Carlene Coria RN Entered By: Carlene Coria on 10/15/2020 09:24:20 Hemminger, Rieley Lemmie Evens ([id number]) -------------------------------------------------------------------------------- Pain Assessment Details Patient Name: Rivka Safer Date of Service: 10/15/2020 8:45 AM Medical Record Number: [id number] Patient Account Number: [id number] Date of Birth/Sex: November 28, 1960 (60 y.o. F) Treating RN: Carlene Coria Primary Care Matalie Romberger: Fulton Reek Other Clinician: Referring Rylyn Ranganathan: Herbert Pun Treating Haze Antillon/Extender: Skipper Cliche in Treatment: 0 Active Problems Location of Pain Severity and Description of Pain Patient Has Paino No Site Locations Pain Management and Medication Current Pain Management: Electronic Signature(s) Signed: 10/21/2020 7:54:12 AM By: Carlene Coria RN Entered By: Carlene Coria on 10/15/2020 09:00:11 Rivka Safer ([id number]) -------------------------------------------------------------------------------- Patient/Caregiver Education Details Patient Name: Rivka Safer Date of Service:  10/15/2020 8:45 AM Medical Record Number: [id number] Patient Account Number: [id number] Date of Birth/Gender: 10-25-1960 (60 y.o. F) Treating RN: Carlene Coria Primary Care Physician: Fulton Reek Other Clinician: Referring Physician: Herbert Pun Treating Physician/Extender: Skipper Cliche in Treatment: 0 Education Assessment Education Provided To: Patient Education Topics Provided Wound/Skin Impairment: Methods: Explain/Verbal Responses: State content correctly Electronic Signature(s) Signed: 10/21/2020 7:54:12 AM By: Carlene Coria RN Entered By: Carlene Coria on 10/15/2020 09:56:07 Barse, [name] ([id number]) -------------------------------------------------------------------------------- Wound Assessment Details Patient Name: Rivka Safer Date of Service: 10/15/2020 8:45 AM Medical Record Number: [id number] Patient Account Number: [id number] Date of Birth/Sex: [birth year] (60 y.o. F) Treating RN: Carlene Coria Primary Care Tadd Holtmeyer: Fulton Reek Other Clinician: Referring Kafi Dotter: Herbert Pun Treating Jaine Estabrooks/Extender: Skipper Cliche in Treatment: 0 Wound Status Wound Number: 1 Primary Etiology: Open Surgical Wound Wound Location: Abdomen - Lower Quadrant Wound Status: Open Wounding Event: Surgical Injury Comorbid History: Hypertension, Type II Diabetes Date Acquired: 08/18/2020 Weeks Of Treatment: 0 Clustered Wound: No Photos Wound Measurements Length: (cm) 3.5 Width: (cm) 0.4 Depth: (cm) 0.1 Area: (cm) 1.1 Volume: (cm) 0.11 % Reduction in Area: 0% % Reduction in Volume: 0% Epithelialization: None Tunneling: Yes Location 1 Position (o'clock): 9 Maximum Distance: (cm) 1.3 Location 2 Position (o'clock): 4 Maximum Distance: (cm) 0.6 Undermining: No Wound  Description Classification: Full Thickness Without Exposed Support Structu Exudate Amount: Medium Exudate Type: Serosanguineous Exudate Color: red, brown res Foul  Odor After Cleansing: No Slough/Fibrino No Wound Bed Granulation Amount: Large (67-100%) Exposed Structure Granulation Quality: Red Fascia Exposed: No Necrotic Amount: None Present (0%) Fat Layer (Subcutaneous Tissue) Exposed: Yes Tendon Exposed: No Muscle Exposed: No Joint Exposed: No Bone Exposed: No Treatment Notes Wound #1 (Abdomen - Lower Quadrant) Murfin, Vennessa Lemmie Evens ([id number]) Cleanser [place] Ancillary Kit - 15 Day Supply Discharge Instruction: Use supplies as instructed; Kit contains: (15) Saline Bullets; (15) 3x3 Gauze; 15 pr Gloves Soap and Water Discharge Instruction: Gently cleanse wound with antibacterial soap, rinse and pat dry prior to dressing wounds Peri-Wound Care Topical Primary Dressing Foam Dressing, 4x4 (in/in) Endoform 2x2 (in/in) Discharge Instruction: Apply Endoform as directed Secondary Dressing Zetuvit Plus Silicone Border Dressing 5x5 (in/in) Secured With Compression Wrap Compression Stockings Add-Ons Electronic Signature(s) Signed: 10/21/2020 7:54:12 AM By: Carlene Coria RN Entered By: Carlene Coria on 10/15/2020 09:25:46 Rotondo, [name] ([id number]) -------------------------------------------------------------------------------- Vitals Details Patient Name: Rivka Safer Date of Service: 10/15/2020 8:45 AM Medical Record Number: [id number] Patient Account Number: [id number] Date of Birth/Sex: 07/27/1960 (60 y.o. F) Treating RN: Carlene Coria Primary Care Kaiyah Eber: Fulton Reek Other Clinician: Referring Siraj Dermody: Herbert Pun Treating Corinthia Helmers/Extender: Skipper Cliche in Treatment: 0 Vital Signs Time Taken: 09:00 Temperature (F): 98.1 Height (in): 65 Pulse (bpm): 76 Source: Stated Respiratory Rate (breaths/min): 18 Weight (lbs): 220 Blood Pressure (mmHg): 127/79 Source: Stated Reference Range: 80 - 120 mg / dl Body Mass Index (BMI): 36.6 Electronic Signature(s) Signed: 10/21/2020 7:54:12 AM By: Carlene Coria RN Entered By: Carlene Coria on 10/15/2020 09:02:16

## 2020-10-29 ENCOUNTER — Ambulatory Visit: Payer: Managed Care, Other (non HMO) | Admitting: Physician Assistant

## 2020-11-04 ENCOUNTER — Encounter (HOSPITAL_BASED_OUTPATIENT_CLINIC_OR_DEPARTMENT_OTHER): Payer: Managed Care, Other (non HMO) | Admitting: Internal Medicine

## 2020-11-04 ENCOUNTER — Other Ambulatory Visit: Payer: Self-pay

## 2020-11-04 DIAGNOSIS — E11622 Type 2 diabetes mellitus with other skin ulcer: Secondary | ICD-10-CM | POA: Diagnosis not present

## 2020-11-04 DIAGNOSIS — L98492 Non-pressure chronic ulcer of skin of other sites with fat layer exposed: Secondary | ICD-10-CM

## 2020-11-04 DIAGNOSIS — T8131XA Disruption of external operation (surgical) wound, not elsewhere classified, initial encounter: Secondary | ICD-10-CM | POA: Diagnosis not present

## 2020-11-04 DIAGNOSIS — I10 Essential (primary) hypertension: Secondary | ICD-10-CM

## 2020-11-04 NOTE — Progress Notes (Addendum)
LAJEAN, BOESE ([id number]) Visit Report for 11/04/2020 Arrival Information Details Patient Name: Sydney Soto, Sydney Soto. Date of Service: 11/04/2020 3:30 PM Medical Record Number: [id number] Patient Account Number: [id number] Date of Birth/Sex: Dec 29, 1960 (60 y.o. F) Treating RN: Rogers Blocker Primary Care Kaislyn Gulas: Aram Beecham Other Clinician: Referring Oktober Glazer: Aram Beecham Treating Jashawna Reever/Extender: Tilda Franco in Treatment: 2 Visit Information History Since Last Visit Pain Present Now: No Patient Arrived: Ambulatory Arrival Time: 15:21 Accompanied By: self Transfer Assistance: None Patient Identification Verified: Yes Secondary Verification Process Completed: Yes Patient Requires Transmission-Based Precautions: No Patient Has Alerts: No Electronic Signature(s) Signed: 11/04/2020 4:20:49 PM By: Rogers Blocker RN Entered By: Rogers Blocker on 11/04/2020 15:22:05 Sharla Kidney ([id number]) -------------------------------------------------------------------------------- Clinic Level of Care Assessment Details Patient Name: Sharla Kidney Date of Service: 11/04/2020 3:30 PM Medical Record Number: [id number] Patient Account Number: [id number] Date of Birth/Sex: [birth year] (60 y.o. F) Treating RN: Rogers Blocker Primary Care Nansi Birmingham: Aram Beecham Other Clinician: Referring Trenisha Lafavor: Aram Beecham Treating Channelle Bottger/Extender: Tilda Franco in Treatment: 2 Clinic Level of Care Assessment Items TOOL 4 Quantity Score X - Use when only an EandM is performed on FOLLOW-UP visit 1 0 ASSESSMENTS - Nursing Assessment / Reassessment X - Reassessment of Co-morbidities (includes updates in patient status) 1 10 X- 1 5 Reassessment of Adherence to Treatment Plan ASSESSMENTS - Wound and Skin Assessment / Reassessment X - Simple Wound Assessment / Reassessment - one wound 1 5 []  - 0 Complex Wound Assessment / Reassessment - multiple  wounds []  - 0 Dermatologic / Skin Assessment (not related to wound area) ASSESSMENTS - Focused Assessment []  - Circumferential Edema Measurements - multi extremities 0 []  - 0 Nutritional Assessment / Counseling / Intervention []  - 0 Lower Extremity Assessment (monofilament, tuning fork, pulses) []  - 0 Peripheral Arterial Disease Assessment (using hand held doppler) ASSESSMENTS - Ostomy and/or Continence Assessment and Care []  - Incontinence Assessment and Management 0 []  - 0 Ostomy Care Assessment and Management (repouching, etc.) PROCESS - Coordination of Care X - Simple Patient / Family Education for ongoing care 1 15 []  - 0 Complex (extensive) Patient / Family Education for ongoing care []  - 0 Staff obtains , Records, Test Results / Process Orders []  - 0 Staff telephones HHA, Nursing Homes / Clarify orders / etc []  - 0 Routine Transfer to another Facility (non-emergent condition) []  - 0 Routine Hospital Admission (non-emergent condition) []  - 0 New Admissions / / Ordering NPWT, Apligraf, etc. []  - 0 Emergency Hospital Admission (emergent condition) X- 1 10 Simple Discharge Coordination []  - 0 Complex (extensive) Discharge Coordination PROCESS - Special Needs []  - Pediatric / Minor Patient Management 0 []  - 0 Isolation Patient Management []  - 0 Hearing / Language / Visual special needs []  - 0 Assessment of Community assistance (transportation, D/C planning, etc.) []  - 0 Additional assistance / Altered mentation []  - 0 Support Surface(s) Assessment (bed, cushion, seat, etc.) INTERVENTIONS - Wound Cleansing / Measurement Sahagun, Angela H. ( ) X- 1 5 Simple Wound Cleansing - one wound []  - 0 Complex Wound Cleansing - multiple wounds X- 1 5 Wound Imaging (photographs - any number of wounds) []  - 0 Wound Tracing (instead of photographs) X- 1 5 Simple Wound Measurement - one wound []  - 0 Complex Wound Measurement -  multiple wounds INTERVENTIONS - Wound Dressings []  - Small Wound Dressing one or multiple wounds 0 X- 1 15 Medium Wound Dressing one or multiple wounds []  - 0 Large Wound  Dressing one or multiple wounds []  - 0 Application of Medications - topical []  - 0 Application of Medications - injection INTERVENTIONS - Miscellaneous []  - External ear exam 0 []  - 0 Specimen Collection (cultures, biopsies, blood, body fluids, etc.) []  - 0 Specimen(s) / Culture(s) sent or taken to Lab for analysis []  - 0 Patient Transfer (multiple staff / Lift / Similar devices) []  - 0 Simple Staple / Suture removal (25 or less) []  - 0 Complex Staple / Suture removal (26 or more) []  - 0 Hypo / Hyperglycemic Management (close monitor of Blood Glucose) []  - 0 Ankle / Brachial Index (ABI) - do not check if billed separately X- 1 5 Vital Signs Has the patient been seen at the hospital within the last three years: Yes Total Score: 80 Level Of Care: New/Established - Level 3 Electronic Signature(s) Signed: 11/04/2020 4:20:49 PM By: RN Entered By: on 11/04/2020 16:19:14 Fahy, (Michiel Sites) -------------------------------------------------------------------------------- Encounter Discharge Information Details Patient Name: Date of Service: 11/04/2020 3:30 PM Medical Record Number: Patient Account Number: Date of Birth/Sex: 03/16/1960 (60 y.o. F) Treating RN: Rogers Blocker Primary Care Aidynn Polendo: 11/06/2020 Other Clinician: Referring Ricky Doan: Ezequiel Essex Treating Rylend Pietrzak/Extender: [id number] in Treatment: 2 Encounter Discharge Information Items Discharge Condition: Stable Ambulatory Status: Ambulatory Discharge Destination: Home Transportation: Private Auto Accompanied By: self Schedule Follow-up Appointment: Yes Clinical Summary of Care: Electronic Signature(s) Signed: 11/04/2020  4:20:49 PM By: 11/06/2020 RN Entered By: [id number] on 11/04/2020 16:20:02 02/08/1961 (42) -------------------------------------------------------------------------------- Lower Extremity Assessment Details Patient Name: Rogers Blocker Date of Service: 11/04/2020 3:30 PM Medical Record Number: Aram Beecham Patient Account Number: Tilda Franco Date of Birth/Sex: [birth year] (60 y.o. F) Treating RN: Rogers Blocker Primary Care Milas Schappell: 11/06/2020 Other Clinician: Referring Kailiana Granquist: Sharla Kidney Treating Katelin Kutsch/Extender: [id number] in Treatment: 2 Electronic Signature(s) Signed: 11/04/2020 4:20:49 PM By: 11/06/2020 RN Entered By: [id number] on 11/04/2020 15:32:05 02/08/1961 (42) -------------------------------------------------------------------------------- Multi Wound Chart Details Patient Name: Rogers Blocker Date of Service: 11/04/2020 3:30 PM Medical Record Number: Aram Beecham Patient Account Number: Tilda Franco Date of Birth/Sex: 16-Feb-1960 (60 y.o. F) Treating RN: Rogers Blocker Primary Care Raela Bohl: 11/06/2020 Other Clinician: Referring Skylan Gift: Sharla Kidney Treating Jerra Huckeby/Extender: [id number] in Treatment: 2 Vital Signs Height(in): 65 Pulse(bpm): 76 Weight(lbs): 220 Blood Pressure(mmHg): 119/83 Body Mass Index(BMI): 37 Temperature(F): 98.3 Respiratory Rate(breaths/min): 18 Photos: [N/A:N/A] Wound Location: Abdomen - Lower Quadrant N/A N/A Wounding Event: Surgical Injury N/A N/A Primary Etiology: Open Surgical Wound N/A N/A Comorbid History: Hypertension, Type II Diabetes N/A N/A Date Acquired: 08/18/2020 N/A N/A Weeks of Treatment: 2 N/A N/A Wound Status: Open N/A N/A Measurements L x W x D (cm) 0.1x0.1x0.1 N/A N/A Area (cm) : 0.008 N/A N/A Volume (cm) : 0.001 N/A N/A % Reduction in Area: 99.30% N/A N/A % Reduction in Volume: 99.10% N/A  N/A Classification: Full Thickness Without Exposed N/A N/A Support Structures Exudate Amount: None Present N/A N/A Granulation Amount: None Present (0%) N/A N/A Necrotic Amount: None Present (0%) N/A N/A Exposed Structures: Fascia: No N/A N/A Fat Layer (Subcutaneous Tissue): No Tendon: No Muscle: No Joint: No Bone: No Epithelialization: Large (67-100%) N/A N/A Treatment Notes Wound #1 (Abdomen - Lower Quadrant) Cleanser Soap and Water Discharge Instruction: Gently cleanse wound with antibacterial soap, rinse and pat dry prior to dressing wounds Peri-Wound Care Topical Primary Dressing Endoform 2x2 (in/in) Gade, Brandilynn H. (  [id number]) Discharge Instruction: Apply Endoform as directed Secondary Dressing Zetuvit Plus Silicone Border Dressing 5x5 (in/in) Discharge Instruction: Secure dressing Secured With Compression Wrap Compression Stockings Add-Ons Electronic Signature(s) Signed: 11/04/2020 4:27:38 PM By: Geralyn Corwin DO Previous Signature: 11/04/2020 4:20:49 PM Version By: Rogers Blocker RN Entered By: Geralyn Corwin on 11/04/2020 16:22:45 Sharla Kidney ([id number]) -------------------------------------------------------------------------------- Multi-Disciplinary Care Plan Details Patient Name: Sharla Kidney Date of Service: 11/04/2020 3:30 PM Medical Record Number: [id number] Patient Account Number: [id number] Date of Birth/Sex: 1960-12-22 (60 y.o. F) Treating RN: Rogers Blocker Primary Care Marquice Uddin: Aram Beecham Other Clinician: Referring Walfred Bettendorf: Aram Beecham Treating Lashane Whelpley/Extender: Tilda Franco in Treatment: 2 Active Inactive Electronic Signature(s) Signed: 11/11/2020 1:47:34 PM By: Elliot Gurney, BSN, RN, CWS, Kim RN, BSN Signed: 11/27/2020 5:06:41 PM By: Rogers Blocker RN Previous Signature: 11/04/2020 4:20:49 PM Version By: Rogers Blocker RN Entered By: Elliot Gurney BSN, RN, CWS, Kim on 11/11/2020 13:47:33 Milkovich,  Ezequiel Essex ([id number]) -------------------------------------------------------------------------------- Pain Assessment Details Patient Name: Sharla Kidney Date of Service: 11/04/2020 3:30 PM Medical Record Number: [id number] Patient Account Number: [id number] Date of Birth/Sex: 10-13-1960 (60 y.o. F) Treating RN: Rogers Blocker Primary Care Keonna Raether: Aram Beecham Other Clinician: Referring Eliane Hammersmith: Aram Beecham Treating Makyi Ledo/Extender: Tilda Franco in Treatment: 2 Active Problems Location of Pain Severity and Description of Pain Patient Has Paino No Site Locations Rate the pain. Current Pain Level: 0 Pain Management and Medication Current Pain Management: Electronic Signature(s) Signed: 11/04/2020 4:20:49 PM By: Rogers Blocker RN Entered By: Rogers Blocker on 11/04/2020 15:25:26 Sharla Kidney ([id number]) -------------------------------------------------------------------------------- Patient/Caregiver Education Details Patient Name: Sharla Kidney Date of Service: 11/04/2020 3:30 PM Medical Record Number: [id number] Patient Account Number: [id number] Date of Birth/Gender: 1961-01-18 (60 y.o. F) Treating RN: Rogers Blocker Primary Care Physician: Aram Beecham Other Clinician: Referring Physician: Aram Beecham Treating Physician/Extender: Tilda Franco in Treatment: 2 Education Assessment Education Provided To: Patient Education Topics Provided Wound/Skin Impairment: Methods: Explain/Verbal Responses: State content correctly Electronic Signature(s) Signed: 11/04/2020 4:20:49 PM By: Rogers Blocker RN Entered By: Rogers Blocker on 11/04/2020 16:19:38 Shonk, Ezequiel Essex ([id number]) -------------------------------------------------------------------------------- Wound Assessment Details Patient Name: Sharla Kidney Date of Service: 11/04/2020 3:30 PM Medical Record Number: [id number] Patient Account  Number: [id number] Date of Birth/Sex: 1960-08-29 (60 y.o. F) Treating RN: Rogers Blocker Primary Care Jaleiah Asay: Aram Beecham Other Clinician: Referring Bina Veenstra: Aram Beecham Treating Hannahgrace Lalli/Extender: Tilda Franco in Treatment: 2 Wound Status Wound Number: 1 Primary Etiology: Open Surgical Wound Wound Location: Abdomen - Lower Quadrant Wound Status: Open Wounding Event: Surgical Injury Comorbid History: Hypertension, Type II Diabetes Date Acquired: 08/18/2020 Weeks Of Treatment: 2 Clustered Wound: No Photos Wound Measurements Length: (cm) 0.1 Width: (cm) 0.1 Depth: (cm) 0.1 Area: (cm) 0.008 Volume: (cm) 0.001 % Reduction in Area: 99.3% % Reduction in Volume: 99.1% Epithelialization: Large (67-100%) Tunneling: No Undermining: No Wound Description Classification: Full Thickness Without Exposed Support Structure Exudate Amount: None Present s Foul Odor After Cleansing: No Slough/Fibrino No Wound Bed Granulation Amount: None Present (0%) Exposed Structure Necrotic Amount: None Present (0%) Fascia Exposed: No Fat Layer (Subcutaneous Tissue) Exposed: No Tendon Exposed: No Muscle Exposed: No Joint Exposed: No Bone Exposed: No Electronic Signature(s) Signed: 11/04/2020 4:20:49 PM By: Rogers Blocker RN Entered By: Rogers Blocker on 11/04/2020 15:30:13 Sharla Kidney ([id number]) -------------------------------------------------------------------------------- Vitals Details Patient Name: Sharla Kidney Date of Service: 11/04/2020 3:30 PM Medical Record Number: [id number] Patient Account Number: [id number] Date of Birth/Sex: 1960-07-14 (60 y.o. F) Treating RN:  Rogers Blocker Primary Care Maurianna Benard: Aram Beecham Other Clinician: Referring Nikoli Nasser: Aram Beecham Treating Adelae Yodice/Extender: Tilda Franco in Treatment: 2 Vital Signs Time Taken: 15:23 Temperature (F): 98.3 Height (in): 65 Pulse (bpm): 76 Weight (lbs):  220 Respiratory Rate (breaths/min): 18 Body Mass Index (BMI): 36.6 Blood Pressure (mmHg): 119/83 Reference Range: 80 - 120 mg / dl Electronic Signature(s) Signed: 11/04/2020 4:20:49 PM By: Rogers Blocker RN Entered By: Rogers Blocker on 11/04/2020 15:25:10

## 2020-11-05 NOTE — Progress Notes (Signed)
Sydney Soto, Sydney Soto ([id number]) Visit Report for 11/04/2020 Chief Complaint Document Details Patient Name: Sydney Soto, Sydney Soto. Date of Service: 11/04/2020 3:30 PM Medical Record Number: [id number] Patient Account Number: [id number] Date of Birth/Sex: 12-10-1960 (60 y.o. F) Treating RN: Rogers Blocker Primary Care Provider: Aram Beecham Other Clinician: Referring Provider: Aram Beecham Treating Provider/Extender: Tilda Franco in Treatment: 2 Information Obtained from: Patient Chief Complaint Abdominal surgical/abscess ulcer Electronic Signature(s) Signed: 11/04/2020 4:27:38 PM By: Geralyn Corwin DO Entered By: Geralyn Corwin on 11/04/2020 16:22:54 Sydney Soto, Sydney Soto ([id number]) -------------------------------------------------------------------------------- HPI Details Patient Name: Sydney Soto Date of Service: 11/04/2020 3:30 PM Medical Record Number: [id number] Patient Account Number: [id number] Date of Birth/Sex: 1960-08-04 (60 y.o. F) Treating RN: Rogers Blocker Primary Care Provider: Aram Beecham Other Clinician: Referring Provider: Aram Beecham Treating Provider/Extender: Tilda Franco in Treatment: 2 History of Present Illness HPI Description: 10/15/2020 upon evaluation today patient appears to be doing somewhat poorly in regard to a wound that she noted began in May she had an abscess in lower midline abdominal area in the suprapubic region. Subsequently she tells me that during that time she ended up having to have surgery and this in fact was twice to try to clean the area out. Following the surgery the last time she developed a hematoma and that is what were dealing with more so than anything. She is actually made some good progress she tells me over the past 2 weeks to the point that she almost did not come today. She wants to try to stretch out her visits to every 2 weeks. With that being said she does have a history  of diabetes mellitus type 2 as well as hypertension she is on medication for both. 9/28; patient presents today for follow-up. She has been using endoform to the wound site and reports improvement. She has no issues or complaints today. She denies signs of infection. Electronic Signature(s) Signed: 11/04/2020 4:27:38 PM By: Geralyn Corwin DO Entered By: Geralyn Corwin on 11/04/2020 16:23:30 Sydney Soto, Sydney Soto ([id number]) -------------------------------------------------------------------------------- Physical Exam Details Patient Name: Sydney Soto Date of Service: 11/04/2020 3:30 PM Medical Record Number: [id number] Patient Account Number: [id number] Date of Birth/Sex: Apr 03, 1960 (60 y.o. F) Treating RN: Rogers Blocker Primary Care Provider: Aram Beecham Other Clinician: Referring Provider: Aram Beecham Treating Provider/Extender: Tilda Franco in Treatment: 2 Constitutional . Psychiatric . Notes Abdomen: There is an incision site from a previous surgery that is healed. Directly underneath this there is a small wound with granulation tissue with skin bridging over it. No signs of infection. Electronic Signature(s) Signed: 11/04/2020 4:27:38 PM By: Geralyn Corwin DO Entered By: Geralyn Corwin on 11/04/2020 16:24:35 Sydney Soto, Sydney Soto ([id number]) -------------------------------------------------------------------------------- Physician Orders Details Patient Name: Sydney Soto Date of Service: 11/04/2020 3:30 PM Medical Record Number: [id number] Patient Account Number: [id number] Date of Birth/Sex: 31-Oct-1960 (60 y.o. F) Treating RN: Rogers Blocker Primary Care Provider: Aram Beecham Other Clinician: Referring Provider: Aram Beecham Treating Provider/Extender: Tilda Franco in Treatment: 2 Verbal / Phone Orders: No Diagnosis Coding Follow-up Appointments o Return Appointment in 2 weeks. Bathing/ Shower/  Hygiene o Clean wound with Normal Saline or wound cleanser. - May use water proof bandages Wound Treatment Wound #1 - Abdomen - Lower Quadrant Cleanser: Soap and Water 3 x Per Week/30 Days Discharge Instructions: Gently cleanse wound with antibacterial soap, rinse and pat dry prior to dressing wounds Primary Dressing: Endoform 2x2 (in/in) 3 x Per Week/30 Days Discharge Instructions: Apply Endoform as directed Secondary Dressing: Zetuvit Plus  Silicone Border Dressing 5x5 (in/in) (Generic) 3 x Per Week/30 Days Discharge Instructions: Secure dressing Electronic Signature(s) Signed: 11/04/2020 4:20:49 PM By: Rogers Blocker RN Signed: 11/04/2020 4:27:38 PM By: Geralyn Corwin DO Entered By: Rogers Blocker on 11/04/2020 16:13:21 Sydney Soto, Sydney Soto ([id number]) -------------------------------------------------------------------------------- Problem List Details Patient Name: Sydney Soto Date of Service: 11/04/2020 3:30 PM Medical Record Number: [id number] Patient Account Number: [id number] Date of Birth/Sex: 06-27-1960 (60 y.o. F) Treating RN: Rogers Blocker Primary Care Provider: Aram Beecham Other Clinician: Referring Provider: Aram Beecham Treating Provider/Extender: Tilda Franco in Treatment: 2 Active Problems ICD-10 Encounter Code Description Active Date MDM Diagnosis T81.31XA Disruption of external operation (surgical) wound, not elsewhere 10/15/2020 No Yes classified, initial encounter L98.492 Non-pressure chronic ulcer of skin of other sites with fat layer exposed 10/15/2020 No Yes E11.622 Type 2 diabetes mellitus with other skin ulcer 10/15/2020 No Yes I10 Essential (primary) hypertension 10/15/2020 No Yes Inactive Problems Resolved Problems Electronic Signature(s) Signed: 11/04/2020 4:27:38 PM By: Geralyn Corwin DO Entered By: Geralyn Corwin on 11/04/2020 16:22:37 Sydney Soto, Sydney Soto  ([id number]) -------------------------------------------------------------------------------- Progress Note Details Patient Name: Sydney Soto. Date of Service: 11/04/2020 3:30 PM Medical Record Number: [id number] Patient Account Number: [id number] Date of Birth/Sex: Jun 02, 1960 (60 y.o. F) Treating RN: Rogers Blocker Primary Care Provider: Aram Beecham Other Clinician: Referring Provider: Aram Beecham Treating Provider/Extender: Tilda Franco in Treatment: 2 Subjective Chief Complaint Information obtained from Patient Abdominal surgical/abscess ulcer History of Present Illness (HPI) 10/15/2020 upon evaluation today patient appears to be doing somewhat poorly in regard to a wound that she noted began in May she had an abscess in lower midline abdominal area in the suprapubic region. Subsequently she tells me that during that time she ended up having to have surgery and this in fact was twice to try to clean the area out. Following the surgery the last time she developed a hematoma and that is what were dealing with more so than anything. She is actually made some good progress she tells me over the past 2 weeks to the point that she almost did not come today. She wants to try to stretch out her visits to every 2 weeks. With that being said she does have a history of diabetes mellitus type 2 as well as hypertension she is on medication for both. 9/28; patient presents today for follow-up. She has been using endoform to the wound site and reports improvement. She has no issues or complaints today. She denies signs of infection. Patient History Information obtained from Patient. Social History Never smoker, Marital Status - Married, Alcohol Use - Never, Drug Use - No History, Caffeine Use - Rarely. Medical History Cardiovascular Patient has history of Hypertension Endocrine Patient has history of Type II Diabetes Objective Constitutional Vitals Time Taken: 3:23 PM,  Height: 65 in, Weight: 220 lbs, BMI: 36.6, Temperature: 98.3 F, Pulse: 76 bpm, Respiratory Rate: 18 breaths/min, Blood Pressure: 119/83 mmHg. General Notes: Abdomen: There is an incision site from a previous surgery that is healed. Directly underneath this there is a small wound with granulation tissue with skin bridging over it. No signs of infection. Integumentary (Hair, Skin) Wound #1 status is Open. Original cause of wound was Surgical Injury. The date acquired was: 08/18/2020. The wound has been in treatment 2 weeks. The wound is located on the Abdomen - Lower Quadrant. The wound measures 0.1cm length x 0.1cm width x 0.1cm depth; 0.008cm^2 area and 0.001cm^3 volume. There is no tunneling or undermining noted. There is  a none present amount of drainage noted. There is no granulation within the wound bed. There is no necrotic tissue within the wound bed. Assessment Active Problems Sydney Soto, Sydney Soto. ([id number]) ICD-10 Disruption of external operation (surgical) wound, not elsewhere classified, initial encounter Non-pressure chronic ulcer of skin of other sites with fat layer exposed Type 2 diabetes mellitus with other skin ulcer Essential (primary) hypertension Patient's wounds have improved since last clinic visit. She now has a small wound present with skin bridging over it. I recommended using endoform to this area. If this does not heal she may need this area removed to facilitate wound healing. Plan Follow-up Appointments: Return Appointment in 2 weeks. Bathing/ Shower/ Hygiene: Clean wound with Normal Saline or wound cleanser. - May use water proof bandages WOUND #1: - Abdomen - Lower Quadrant Wound Laterality: Cleanser: Soap and Water 3 x Per Week/30 Days Discharge Instructions: Gently cleanse wound with antibacterial soap, rinse and pat dry prior to dressing wounds Primary Dressing: Endoform 2x2 (in/in) 3 x Per Week/30 Days Discharge Instructions: Apply Endoform as  directed Secondary Dressing: Zetuvit Plus Silicone Border Dressing 5x5 (in/in) (Generic) 3 x Per Week/30 Days Discharge Instructions: Secure dressing 1. Endoform 2. Follow-up in 2 weeks Electronic Signature(s) Signed: 11/04/2020 4:27:38 PM By: Geralyn Corwin DO Entered By: Geralyn Corwin on 11/04/2020 16:26:16 Sydney Soto, Sydney Soto ([id number]) -------------------------------------------------------------------------------- ROS/PFSH Details Patient Name: Sydney Soto Date of Service: 11/04/2020 3:30 PM Medical Record Number: [id number] Patient Account Number: [id number] Date of Birth/Sex: 1960-10-14 (60 y.o. F) Treating RN: Rogers Blocker Primary Care Provider: Aram Beecham Other Clinician: Referring Provider: Aram Beecham Treating Provider/Extender: Tilda Franco in Treatment: 2 Information Obtained From Patient Cardiovascular Medical History: Positive for: Hypertension Endocrine Medical History: Positive for: Type II Diabetes Time with diabetes: 6 months Treated with: Oral agents Blood sugar tested every day: No Immunizations Pneumococcal Vaccine: Received Pneumococcal Vaccination: No Implantable Devices None Family and Social History Never smoker; Marital Status - Married; Alcohol Use: Never; Drug Use: No History; Caffeine Use: Rarely; Financial Concerns: No; Food, Clothing or Shelter Needs: No; Support System Lacking: No; Transportation Concerns: No Electronic Signature(s) Signed: 11/04/2020 4:27:38 PM By: Geralyn Corwin DO Signed: 11/05/2020 4:16:02 PM By: Rogers Blocker RN Entered By: Geralyn Corwin on 11/04/2020 16:23:36 [place], Sydney Soto ([id number]) -------------------------------------------------------------------------------- SuperBill Details Patient Name: Sydney Soto Date of Service: 11/04/2020 Medical Record Number: [id number] Patient Account Number: [id number] Date of Birth/Sex: [birth year] (60 y.o. F) Treating  RN: Rogers Blocker Primary Care Provider: Aram Beecham Other Clinician: Referring Provider: Aram Beecham Treating Provider/Extender: Tilda Franco in Treatment: 2 Diagnosis Coding ICD-10 Codes Code Description T81.31XA Disruption of external operation (surgical) wound, not elsewhere classified, initial encounter L98.492 Non-pressure chronic ulcer of skin of other sites with fat layer exposed E11.622 Type 2 diabetes mellitus with other skin ulcer I10 Essential (primary) hypertension Facility Procedures CPT4 Code: 44034742 Description: 99213 - WOUND CARE VISIT-LEV 3 EST PT Modifier: Quantity: 1 Physician Procedures CPT4 Code: 5956387 Description: 99213 - WC PHYS LEVEL 3 - EST PT Modifier: Quantity: 1 CPT4 Code: Description: ICD-10 Diagnosis Description T81.31XA Disruption of external operation (surgical) wound, not elsewhere classifi L98.492 Non-pressure chronic ulcer of skin of other sites with fat layer exposed E11.622 Type 2 diabetes mellitus with other skin  ulcer I10 Essential (primary) hypertension Modifier: ed, initial encounter Quantity: Electronic Signature(s) Signed: 11/04/2020 4:27:38 PM By: Geralyn Corwin DO Previous Signature: 11/04/2020 4:20:49 PM Version By: Rogers Blocker RN Entered By: Geralyn Corwin on 11/04/2020 16:26:29

## 2020-11-17 ENCOUNTER — Other Ambulatory Visit (HOSPITAL_BASED_OUTPATIENT_CLINIC_OR_DEPARTMENT_OTHER): Payer: Self-pay | Admitting: Internal Medicine

## 2020-11-17 ENCOUNTER — Other Ambulatory Visit: Payer: Self-pay | Admitting: Internal Medicine

## 2020-11-17 DIAGNOSIS — I10 Essential (primary) hypertension: Secondary | ICD-10-CM

## 2020-11-17 DIAGNOSIS — R1031 Right lower quadrant pain: Secondary | ICD-10-CM

## 2020-11-26 ENCOUNTER — Other Ambulatory Visit: Payer: Self-pay | Admitting: Internal Medicine

## 2020-11-26 ENCOUNTER — Ambulatory Visit
Admission: RE | Admit: 2020-11-26 | Discharge: 2020-11-26 | Disposition: A | Discharge status: Home / Self Care | Payer: Managed Care, Other (non HMO) | Source: Ambulatory Visit | Attending: Internal Medicine | Admitting: Internal Medicine

## 2020-11-26 DIAGNOSIS — R1032 Left lower quadrant pain: Secondary | ICD-10-CM | POA: Insufficient documentation

## 2020-11-26 DIAGNOSIS — I10 Essential (primary) hypertension: Secondary | ICD-10-CM

## 2020-11-26 DIAGNOSIS — R1031 Right lower quadrant pain: Secondary | ICD-10-CM | POA: Insufficient documentation

## 2020-12-11 NOTE — Patient Instructions (Signed)
Breast Self-Awareness Breast self-awareness is knowing how your breasts look and feel. Doing breast self-awareness is important. It allows you to catch a breast problem early while it is still small and can be treated. All women should do breast self-awareness, including women who have had breast implants. Tell your doctor if you notice a change in your breasts. What you need: A mirror. A well-lit room. How to do a breast self-exam A breast self-exam is one way to learn what is normal for your breasts and to check for changes. To do a breast self-exam: Look for changes  Take off all the clothes above your waist. Stand in front of a mirror in a room with good lighting. Put your hands on your hips. Push your hands down. Look at your breasts and nipples in the mirror to see if one breast or nipple looks different from the other. Check to see if: The shape of one breast is different. The size of one breast is different. There are wrinkles, dips, and bumps in one breast and not the other. Look at each breast for changes in the skin, such as: Redness. Scaly areas. Look for changes in your nipples, such as: Liquid around the nipples. Bleeding. Dimpling. Redness. A change in where the nipples are. Feel for changes  Lie on your back on the floor. Feel each breast. To do this, follow these steps: Pick a breast to feel. Put the arm closest to that breast above your head. Use your other arm to feel the nipple area of your breast. Feel the area with the pads of your three middle fingers by making small circles with your fingers. For the first circle, press lightly. For the second circle, press harder. For the third circle, press even harder. Keep making circles with your fingers at the different pressures as you move down your breast. Stop when you feel your ribs. Move your fingers a little toward the center of your body. Start making circles with your fingers again, this time going up until  you reach your collarbone. Keep making up-and-down circles until you reach your armpit. Remember to keep using the three pressures. Feel the other breast in the same way. Sit or stand in the tub or shower. With soapy water on your skin, feel each breast the same way you did in step 2 when you were lying on the floor. Write down what you find Writing down what you find can help you remember what to tell your doctor. Write down: What is normal for each breast. Any changes you find in each breast, including: The kind of changes you find. Whether you have pain. Size and location of any lumps. When you last had your menstrual period. General tips Check your breasts every month. If you are breastfeeding, the best time to check your breasts is after you feed your baby or after you use a breast pump. If you get menstrual periods, the best time to check your breasts is 5-7 days after your menstrual period is over. With time, you will become comfortable with the self-exam, and you will begin to know if there are changes in your breasts. Contact a doctor if you: See a change in the shape or size of your breasts or nipples. See a change in the skin of your breast or nipples, such as red or scaly skin. Have fluid coming from your nipples that is not normal. Find a lump or thick area that was not there before. Have pain in   your breasts. °Have any concerns about your breast health. °Summary °Breast self-awareness includes looking for changes in your breasts, as well as feeling for changes within your breasts. °Breast self-awareness should be done in front of a mirror in a well-lit room. °You should check your breasts every month. If you get menstrual periods, the best time to check your breasts is 5-7 days after your menstrual period is over. °Let your doctor know of any changes you see in your breasts, including changes in size, changes on the skin, pain or tenderness, or fluid from your nipples that is not  normal. °This information is not intended to replace advice given to you by your health care provider. Make sure you discuss any questions you have with your health care provider. °Document Revised: 09/12/2017 Document Reviewed: 09/12/2017 °Elsevier Patient Education © 2022 [employer] °Preventive Care 40-64 Years Old, Female °Preventive care refers to lifestyle choices and visits with your health care provider that can promote health and wellness. Preventive care visits are also called wellness exams. °What can I expect for my preventive care visit? °Counseling °Your health care provider may ask you questions about your: °Medical history, including: °Past medical problems. °Family medical history. °Pregnancy history. °Current health, including: °Menstrual cycle. °Method of birth control. °Emotional well-being. °Home life and relationship well-being. °Sexual activity and sexual health. °Lifestyle, including: °Alcohol, nicotine or tobacco, and drug use. °Access to firearms. °Diet, exercise, and sleep habits. °Work and work environment. °Sunscreen use. °Safety issues such as seatbelt and bike helmet use. °Physical exam °Your health care provider will check your: °Height and weight. These may be used to calculate your BMI (body mass index). BMI is a measurement that tells if you are at a healthy weight. °Waist circumference. This measures the distance around your waistline. This measurement also tells if you are at a healthy weight and may help predict your risk of certain diseases, such as type 2 diabetes and high blood pressure. °Heart rate and blood pressure. °Body temperature. °Skin for abnormal spots. °What immunizations do I need? °Vaccines are usually given at various ages, according to a schedule. Your health care provider will recommend vaccines for you based on your age, medical history, and lifestyle or other factors, such as travel or where you work. °What tests do I need? °Screening °Your health care  provider may recommend screening tests for certain conditions. This may include: °Lipid and cholesterol levels. °Diabetes screening. This is done by checking your blood sugar (glucose) after you have not eaten for a while (fasting). °Pelvic exam and Pap test. °Hepatitis B test. °Hepatitis C test. °HIV (human immunodeficiency virus) test. °STI (sexually transmitted infection) testing, if you are at risk. °Lung cancer screening. °Colorectal cancer screening. °Mammogram. Talk with your health care provider about when you should start having regular mammograms. This may depend on whether you have a family history of breast cancer. °BRCA-related cancer screening. This may be done if you have a family history of breast, ovarian, tubal, or peritoneal cancers. °Bone density scan. This is done to screen for osteoporosis. °Talk with your health care provider about your test results, treatment options, and if necessary, the need for more tests. °Follow these instructions at home: °Eating and drinking ° °Eat a diet that includes fresh fruits and vegetables, whole grains, lean protein, and low-fat dairy products. °Take vitamin and mineral supplements as recommended by your health care provider. °Do not drink alcohol if: °Your health care provider tells you not to drink. °You are pregnant,   may be pregnant, or are planning to become pregnant. °If you drink alcohol: °Limit how much you have to 0-1 drink a day. °Know how much alcohol is in your drink. In the [place] one drink equals one 12 oz bottle of beer (355 mL), one 5 oz glass of wine (148 mL), or one 1½ oz glass of hard liquor (44 mL). °Lifestyle °Brush your teeth every morning and night with fluoride toothpaste. Floss one time each day. °Exercise for at least 30 minutes 5 or more days each week. °Do not use any products that contain nicotine or tobacco. These products include cigarettes, chewing tobacco, and vaping devices, such as e-cigarettes. If you need help quitting, ask  your health care provider. °Do not use drugs. °If you are sexually active, practice safe sex. Use a condom or other form of protection to prevent STIs. °If you do not wish to become pregnant, use a form of birth control. If you plan to become pregnant, see your health care provider for a prepregnancy visit. °Take aspirin only as told by your health care provider. Make sure that you understand how much to take and what form to take. Work with your health care provider to find out whether it is safe and beneficial for you to take aspirin daily. °Find healthy ways to manage stress, such as: °Meditation, yoga, or listening to music. °Journaling. °Talking to a trusted person. °Spending time with friends and family. °Minimize exposure to UV radiation to reduce your risk of skin cancer. °Safety °Always wear your seat belt while driving or riding in a vehicle. °Do not drive: °If you have been drinking alcohol. Do not ride with someone who has been drinking. °When you are tired or distracted. °While texting. °If you have been using any mind-altering substances or drugs. °Wear a helmet and other protective equipment during sports activities. °If you have firearms in your house, make sure you follow all gun safety procedures. °Seek help if you have been physically or sexually abused. °What's next? °Visit your health care provider once a year for an annual wellness visit. °Ask your health care provider how often you should have your eyes and teeth checked. °Stay up to date on all vaccines. °This information is not intended to replace advice given to you by your health care provider. Make sure you discuss any questions you have with your health care provider. °Document Revised: 07/22/2020 Document Reviewed: 07/22/2020 °Elsevier Patient Education © 2022 [employer] ° °

## 2020-12-11 NOTE — Progress Notes (Deleted)
ANNUAL PREVENTATIVE CARE GYNECOLOGY  ENCOUNTER NOTE  Subjective:       Sydney Soto is a 60 y.o. G31P2002 female here for a routine annual gynecologic exam. The patient is sexually active. The patient is not taking hormone replacement therapy. Patient denies post-menopausal vaginal bleeding. The patient wears seatbelts: yes. Does the patient participates in regular exercise: no. Has the patient ever been transfused or tattooed?: no.   Current complaints: 1.  Reports that pelvic pain she was having last month is improving. Still having an occasional episode here and there, but not as intense as at initial presentation. Thinks it may be more urethral (as she feels bladder pressure), but denies dysuria, hematuria, or frequency. Previously worked up with negative ultrasound and UA.    Gynecologic History No LMP recorded. Patient has had a hysterectomy. Contraception: status post hysterectomy Last Pap: 2017. Results were: normal.  Last mammogram: 08/16/2019. Results were: normal.   Last Colonoscopy: 11/2019. Results were: normal.  Gets screened q 5 years due to family history of colon cancer.  Last Dexa Scan: None   Obstetric History OB History  Gravida Para Term Preterm AB Living  2 2 2     2   SAB IAB Ectopic Multiple Live Births          2    # Outcome Date GA Lbr Len/2nd Weight Sex Delivery Anes PTL Lv  2 Term 1997    F Vag-Spont   LIV  1 Term 51    F Vag-Spont   LIV    Past Medical History:  Diagnosis Date  . Anxiety   . Dysphagia, unspecified   . GERD (gastroesophageal reflux disease)   . Glaucoma   . History of IBS    WITH CONSTIPATION  . Hyperlipidemia   . Hypertension   . Hypothyroidism   . OCD (obsessive compulsive disorder)   . Pre-diabetes    DIET CONTROLLED  . Pulmonary embolism (HCC)    Bilateral   . Thyroid disease     Family History  Problem Relation Age of Onset  . Osteoarthritis Mother   . Colon cancer Mother   . Osteoarthritis Father   .  Diabetes Father   . Thyroid disease Sister   . Diabetes Brother   . Breast cancer Neg Hx   . Ovarian cancer Neg Hx     Past Surgical History:  Procedure Laterality Date  . ABDOMINAL HYSTERECTOMY    . APPENDECTOMY    . BACK SURGERY  09/06/2019  . CHOLECYSTECTOMY    . COLONOSCOPY N/A 07/02/2014   Procedure: COLONOSCOPY;  Surgeon: Manya Silvas, MD;  Location: [facility] ENDOSCOPY;  Service: Endoscopy;  Laterality: N/A;  . COLONOSCOPY WITH PROPOFOL N/A 11/25/2019   Procedure: COLONOSCOPY WITH PROPOFOL;  Surgeon: Toledo, Benay Pike, MD;  Location: [facility] ENDOSCOPY;  Service: Endoscopy;  Laterality: N/A;  . DILATION AND CURETTAGE OF UTERUS    . ESOPHAGOGASTRODUODENOSCOPY N/A 07/02/2014   Procedure: ESOPHAGOGASTRODUODENOSCOPY (EGD);  Surgeon: Manya Silvas, MD;  Location: [facility] ENDOSCOPY;  Service: Endoscopy;  Laterality: N/A;  . TUBAL LIGATION      Social History   Socioeconomic History  . Marital status: Married    Spouse name: Not on file  . Number of children: Not on file  . Years of education: Not on file  . Highest education level: Not on file  Occupational History  . Not on file  Tobacco Use  . Smoking status: Never  . Smokeless tobacco: Never  Vaping Use  .  Vaping Use: Never used  Substance and Sexual Activity  . Alcohol use: No  . Drug use: No  . Sexual activity: Not Currently    Birth control/protection: Surgical  Other Topics Concern  . Not on file  Social History Narrative  . Not on file   Social Determinants of Health   Financial Resource Strain: Not on file  Food Insecurity: Not on file  Transportation Needs: Not on file  Physical Activity: Not on file  Stress: Not on file  Social Connections: Not on file  Intimate Partner Violence: Not on file    Current Outpatient Medications on File Prior to Visit  Medication Sig Dispense Refill  . aspirin 325 MG tablet Take 325 mg by mouth daily.    . bisoprolol-hydrochlorothiazide (ZIAC) 5-6.25 MG tablet Take 1  tablet by mouth daily.    Marland Kitchen buPROPion (WELLBUTRIN SR) 150 MG 12 hr tablet TK 1 T PO 2 TIMES D  3  . cyanocobalamin (,VITAMIN B-12,) 1000 MCG/ML injection Inject 1 mL into the muscle every 30 (thirty) days.    . DULoxetine (CYMBALTA) 30 MG capsule Take 30-60 mg by mouth 2 (two) times daily. 2 cap in the morning and 1 cap at night    . ferrous sulfate 324 MG TBEC Take 324 mg by mouth daily with breakfast.    . levothyroxine (SYNTHROID) 50 MCG tablet Take 50 mcg by mouth daily before breakfast.    . magnesium oxide (MAG-OX) 400 MG tablet Take 400 mg by mouth daily.     No current facility-administered medications on file prior to visit.    Allergies  Allergen Reactions  . Erythromycin Nausea Only    Review of Systems ROS Review of Systems - General ROS: negative for - chills, fatigue, fever, hot flashes, night sweats, weight gain.    Psychological ROS: negative for - anxiety, decreased libido, depression, mood swings, physical abuse or sexual abuse Ophthalmic ROS: negative for - blurry vision, eye pain or loss of vision ENT ROS: negative for - headaches, hearing change, visual changes or vocal changes Allergy and Immunology ROS: negative for - hives, itchy/watery eyes or seasonal allergies Hematological and Lymphatic ROS: negative for - bleeding problems, bruising, swollen lymph nodes or weight loss Endocrine ROS: negative for - galactorrhea, hair pattern changes, hot flashes, malaise/lethargy, mood swings, palpitations, polydipsia/polyuria, skin changes, temperature intolerance or unexpected weight changes Breast ROS: negative for - new or changing breast lumps or nipple discharge Respiratory ROS: negative for - cough or shortness of breath Cardiovascular ROS: negative for - chest pain, irregular heartbeat, palpitations or shortness of breath Gastrointestinal ROS: no abdominal pain, change in bowel habits, or black or bloody stools Genito-Urinary ROS: no dysuria, trouble voiding, or  hematuria. Positive for pelvic pain (intermittent, improving. See HPI).   Musculoskeletal ROS: negative for - joint pain or joint stiffness Neurological ROS: negative for - bowel and bladder control changes Dermatological ROS: negative for rash and skin changes.     Objective:   There were no vitals taken for this visit. CONSTITUTIONAL: Well-developed, well-nourished female in no acute distress. Moderate obesity PSYCHIATRIC: Normal mood and affect. Normal behavior. Normal judgment and thought content. NEUROLGIC: Alert and oriented to person, place, and time. Normal muscle tone coordination. No cranial nerve deficit noted. HENT:  Normocephalic, atraumatic, External right and left ear normal. Oropharynx is clear and moist EYES: Conjunctivae and EOM are normal. Pupils are equal, round, and reactive to light. No scleral icterus.  NECK: Normal range of motion, supple,  no masses.  Normal thyroid.  SKIN: Skin is warm and dry. No rash noted. Not diaphoretic. No erythema. No pallor. CARDIOVASCULAR: Normal heart rate noted, regular rhythm, no murmur.   RESPIRATORY: Small cystocele present.Clear to auscultation bilaterally. Effort and breath sounds normal, no problems with respiration noted. BREASTS: Symmetric in size. No masses, skin changes, nipple drainage, or lymphadenopathy. ABDOMEN: Soft, normal bowel sounds, no distention noted.  No tenderness, rebound or guarding.  BLADDER: Normal PELVIC:  Bladder no bladder distension noted  Urethra: normal appearing urethra with no masses, tenderness or lesions  Vulva: normal without lesions  Vagina: normal appearing vagina with normal color and scant thin white discharge, no lesions.    Cervix: surgically absent  Uterus: surgically absent, vaginal cuff well healed.   Adnexa: normal adnexa in size, nontender and no masses.   RV: External Exam NormaI, No Rectal Masses and Normal Sphincter tone  MUSCULOSKELETAL: Normal range of motion. No tenderness.  No  cyanosis, clubbing, or edema.  2+ distal pulses. LYMPHATIC: No Axillary, Supraclavicular, or Inguinal Adenopathy.   Labs:   Labs reviewed in Care Everywhere  Assessment:   Annual gynecologic examination 60 y.o. Obesity Class 2 Breast cancer screening Family h/o colon cancer Grade 1 cystocele Pelvic pain  Plan:  - Pap: Not needed.  History of hysterectomy.  Was still being screened yearly by her previous GYN. Informed last year that no further paps were indicated.  - Mammogram: Up to date and Ordered for next year. - Stool Guaiac Testing:  Not Ordered. Colonoscopy up to date. To repeat every 5 years due to family h/o colon cancer.  - Labs: None.  Recent labs reviewed in Care Everywhere, performed by PCP.  - Routine preventative health maintenance measures emphasized: Exercise/Diet/Weight control, Alcohol/Substance use risks and Stress Management.  Encourage Vitamin D and calcium supplementation for prevention of osteoporosis.  - Grade 1 cystocele otherwise asymptomatic. No interventions needed currently.  - Pelvic pain slowly improving, still episodic, but not as intense. Discussed other etiologies including urinary source, scar tissue from prior pelvic surgeries.To follow up if pain persists or worsens.  -  COVID vaccination: declined.  - Declines flu vaccine. Return to Clinic - 1 Year   Silvano Bilis, LPN [facility]

## 2020-12-14 ENCOUNTER — Encounter: Payer: Managed Care, Other (non HMO) | Admitting: Obstetrics and Gynecology

## 2020-12-14 ENCOUNTER — Other Ambulatory Visit: Payer: Self-pay

## 2020-12-14 ENCOUNTER — Encounter: Payer: Self-pay | Admitting: Obstetrics and Gynecology

## 2020-12-14 DIAGNOSIS — Z01419 Encounter for gynecological examination (general) (routine) without abnormal findings: Secondary | ICD-10-CM

## 2020-12-14 NOTE — Progress Notes (Signed)
Erroneous encounter.  Patient scheduled for her annual visit to soon.  Rescheduled for next month.

## 2021-01-22 ENCOUNTER — Ambulatory Visit (INDEPENDENT_AMBULATORY_CARE_PROVIDER_SITE_OTHER): Payer: Managed Care, Other (non HMO) | Admitting: Obstetrics and Gynecology

## 2021-01-22 ENCOUNTER — Other Ambulatory Visit: Payer: Self-pay

## 2021-01-22 ENCOUNTER — Encounter: Payer: Self-pay | Admitting: Obstetrics and Gynecology

## 2021-01-22 VITALS — BP 134/81 | HR 64 | Ht 64.0 in | Wt 228.4 lb

## 2021-01-22 DIAGNOSIS — E669 Obesity, unspecified: Secondary | ICD-10-CM

## 2021-01-22 DIAGNOSIS — Z800 Family history of malignant neoplasm of digestive organs: Secondary | ICD-10-CM

## 2021-01-22 DIAGNOSIS — Z01419 Encounter for gynecological examination (general) (routine) without abnormal findings: Secondary | ICD-10-CM | POA: Diagnosis not present

## 2021-01-22 DIAGNOSIS — N9089 Other specified noninflammatory disorders of vulva and perineum: Secondary | ICD-10-CM | POA: Diagnosis not present

## 2021-01-22 NOTE — Patient Instructions (Addendum)
Preventive Care 59-59 Years Old, Female Preventive care refers to lifestyle choices and visits with your health care provider that can promote health and wellness. Preventive care visits are also called wellness exams. What can I expect for my preventive care visit? Counseling Your health care provider may ask you questions about your: Medical history, including: Past medical problems. Family medical history. Pregnancy history. Current health, including: Menstrual cycle. Method of birth control. Emotional well-being. Home life and relationship well-being. Sexual activity and sexual health. Lifestyle, including: Alcohol, nicotine or tobacco, and drug use. Access to firearms. Diet, exercise, and sleep habits. Work and work Statistician. Sunscreen use. Safety issues such as seatbelt and bike helmet use. Physical exam Your health care provider will check your: Height and weight. These may be used to calculate your BMI (body mass index). BMI is a measurement that tells if you are at a healthy weight. Waist circumference. This measures the distance around your waistline. This measurement also tells if you are at a healthy weight and may help predict your risk of certain diseases, such as type 2 diabetes and high blood pressure. Heart rate and blood pressure. Body temperature. Skin for abnormal spots. What immunizations do I need? Vaccines are usually given at various ages, according to a schedule. Your health care provider will recommend vaccines for you based on your age, medical history, and lifestyle or other factors, such as travel or where you work. What tests do I need? Screening Your health care provider may recommend screening tests for certain conditions. This may include: Lipid and cholesterol levels. Diabetes screening. This is done by checking your blood sugar (glucose) after you have not eaten for a while (fasting). Pelvic exam and Pap test. Hepatitis B test. Hepatitis C  test. HIV (human immunodeficiency virus) test. STI (sexually transmitted infection) testing, if you are at risk. Lung cancer screening. Colorectal cancer screening. Mammogram. Talk with your health care provider about when you should start having regular mammograms. This may depend on whether you have a family history of breast cancer. BRCA-related cancer screening. This may be done if you have a family history of breast, ovarian, tubal, or peritoneal cancers. Bone density scan. This is done to screen for osteoporosis. Talk with your health care provider about your test results, treatment options, and if necessary, the need for more tests. Follow these instructions at home: Eating and drinking  Eat a diet that includes fresh fruits and vegetables, whole grains, lean protein, and low-fat dairy products. Take vitamin and mineral supplements as recommended by your health care provider. Do not drink alcohol if: Your health care provider tells you not to drink. You are pregnant, may be pregnant, or are planning to become pregnant. If you drink alcohol: Limit how much you have to 0-1 drink a day. Know how much alcohol is in your drink. In the [place] one drink equals one 12 oz bottle of beer (355 mL), one 5 oz glass of wine (148 mL), or one 1 oz glass of hard liquor (44 mL). Lifestyle Brush your teeth every morning and night with fluoride toothpaste. Floss one time each day. Exercise for at least 30 minutes 5 or more days each week. Do not use any products that contain nicotine or tobacco. These products include cigarettes, chewing tobacco, and vaping devices, such as e-cigarettes. If you need help quitting, ask your health care provider. Do not use drugs. If you are sexually active, practice safe sex. Use a condom or other form of protection to  STIs. °If you do not wish to become pregnant, use a form of birth control. If you plan to become pregnant, see your health care provider for a  prepregnancy visit. °Take aspirin only as told by your health care provider. Make sure that you understand how much to take and what form to take. Work with your health care provider to find out whether it is safe and beneficial for you to take aspirin daily. °Find healthy ways to manage stress, such as: °Meditation, yoga, or listening to music. °Journaling. °Talking to a trusted person. °Spending time with friends and family. °Minimize exposure to UV radiation to reduce your risk of skin cancer. °Safety °Always wear your seat belt while driving or riding in a vehicle. °Do not drive: °If you have been drinking alcohol. Do not ride with someone who has been drinking. °When you are tired or distracted. °While texting. °If you have been using any mind-altering substances or drugs. °Wear a helmet and other protective equipment during sports activities. °If you have firearms in your house, make sure you follow all gun safety procedures. °Seek help if you have been physically or sexually abused. °What's next? °Visit your health care provider once a year for an annual wellness visit. °Ask your health care provider how often you should have your eyes and teeth checked. °Stay up to date on all vaccines. °This information is not intended to replace advice given to you by your health care provider. Make sure you discuss any questions you have with your health care provider. °Document Revised: 07/22/2020 Document Reviewed: 07/22/2020 °Elsevier Patient Education © 2022 [employer] Inc[employer] ° °

## 2021-01-22 NOTE — Progress Notes (Signed)
GYNECOLOGY ANNUAL PHYSICAL EXAM PROGRESS NOTE  Subjective:    Sydney Soto is a 60 y.o. G39P2002 female who presents for an annual exam. The patient is sexually active. The patient participates in regular exercise: no. Has the patient ever been transfused or tattooed?: no. The patient reports that there is not domestic violence in her life.    The patient has the following complaints today:  Reports that she had a boil beneath her pannus that required lancing, which then developed into a hematoma and had to be evacuated. Now has a score.  Complains of irritation above clitoris.  Feels like something is torn.   Gynecologic History:  No LMP recorded. Patient has had a hysterectomy. Contraception: status post hysterectomy History of STI's: Denies Last Pap: 2017. Results were: normal.  Last mammogram: 08/24/2020. Results were: normal Last Colonoscopy: 1 year ago.  Gets screened q 5 years due to family history of colon cancer.  Last Dexa Scan: None   OB History  Gravida Para Term Preterm AB Living  2 2 2  0 0 2  SAB IAB Ectopic Multiple Live Births  0 0 0 0 2    # Outcome Date GA Lbr Len/2nd Weight Sex Delivery Anes PTL Lv  2 Term 1997    F Vag-Spont   LIV  1 Term 7    F Vag-Spont   LIV    Past Medical History:  Diagnosis Date   Anxiety    Dysphagia, unspecified    GERD (gastroesophageal reflux disease)    Glaucoma    History of IBS    WITH CONSTIPATION   Hyperlipidemia    Hypertension    Hypothyroidism    OCD (obsessive compulsive disorder)    Pre-diabetes    DIET CONTROLLED   Pulmonary embolism ([place])    Bilateral    Thyroid disease     Past Surgical History:  Procedure Laterality Date   ABDOMINAL HYSTERECTOMY     APPENDECTOMY     BACK SURGERY  09/06/2019   CHOLECYSTECTOMY     COLONOSCOPY N/A 07/02/2014   Procedure: COLONOSCOPY;  Surgeon: Manya Silvas, MD;  Location: [facility];  Service: Endoscopy;  Laterality: N/A;   COLONOSCOPY WITH  PROPOFOL N/A 11/25/2019   Procedure: COLONOSCOPY WITH PROPOFOL;  Surgeon: Toledo, Benay Pike, MD;  Location: [facility] ENDOSCOPY;  Service: Endoscopy;  Laterality: N/A;   DILATION AND CURETTAGE OF UTERUS     ESOPHAGOGASTRODUODENOSCOPY N/A 07/02/2014   Procedure: ESOPHAGOGASTRODUODENOSCOPY (EGD);  Surgeon: Manya Silvas, MD;  Location: [facility] ENDOSCOPY;  Service: Endoscopy;  Laterality: N/A;   TUBAL LIGATION      Family History  Problem Relation Age of Onset   Osteoarthritis Mother    Colon cancer Mother    Osteoarthritis Father    Diabetes Father    Thyroid disease Sister    Diabetes Brother    Breast cancer Neg Hx    Ovarian cancer Neg Hx     Social History   Socioeconomic History   Marital status: Married    Spouse name: Not on file   Number of children: Not on file   Years of education: Not on file   Highest education level: Not on file  Occupational History   Not on file  Tobacco Use   Smoking status: Never   Smokeless tobacco: Never  Vaping Use   Vaping Use: Never used  Substance and Sexual Activity   Alcohol use: No   Drug use: No   Sexual activity: Not  Currently    Birth control/protection: Surgical  Other Topics Concern   Not on file  Social History Narrative   Not on file   Social Determinants of Health   Financial Resource Strain: Not on file  Food Insecurity: Not on file  Transportation Needs: Not on file  Physical Activity: Not on file  Stress: Not on file  Social Connections: Not on file  Intimate Partner Violence: Not on file    Current Outpatient Medications on File Prior to Visit  Medication Sig Dispense Refill   aspirin 325 MG tablet Take 325 mg by mouth daily.     bisoprolol-hydrochlorothiazide (ZIAC) 5-6.25 MG tablet Take 1 tablet by mouth daily.     buPROPion (WELLBUTRIN SR) 150 MG 12 hr tablet TK 1 T PO 2 TIMES D  3   cyanocobalamin (,VITAMIN B-12,) 1000 MCG/ML injection Inject 1 mL into the muscle every 30 (thirty) days.     DULoxetine  (CYMBALTA) 30 MG capsule Take 30-60 mg by mouth 2 (two) times daily. 2 cap in the morning and 1 cap at night     GLIPIZIDE PO Take by mouth.     levothyroxine (SYNTHROID) 50 MCG tablet Take 50 mcg by mouth daily before breakfast.     Semaglutide,0.25 or 0.5MG /DOS, (OZEMPIC, 0.25 OR 0.5 MG/DOSE,) 2 MG/1.5ML SOPN Inject into the skin. weekly     No current facility-administered medications on file prior to visit.    Allergies  Allergen Reactions   Erythromycin Nausea Only     Review of Systems Constitutional: negative for chills, fatigue, fevers and sweats Eyes: negative for irritation, redness and visual disturbance Ears, nose, mouth, throat, and face: negative for hearing loss, nasal congestion, snoring and tinnitus Respiratory: negative for asthma, cough, sputum Cardiovascular: negative for chest pain, dyspnea, exertional chest pressure/discomfort, irregular heart beat, palpitations and syncope Gastrointestinal: negative for abdominal pain, change in bowel habits, nausea and vomiting Genitourinary: negative for abnormal menstrual periods, genital lesions, sexual problems and vaginal discharge, dysuria and urinary incontinence Integument/breast: negative for breast lump, breast tenderness and nipple discharge. Positive for supra-clitoral irritation.  Hematologic/lymphatic: negative for bleeding and easy bruising Musculoskeletal:negative for back pain and muscle weakness Neurological: negative for dizziness, headaches, vertigo and weakness Endocrine: negative for diabetic symptoms including polydipsia, polyuria and skin dryness Allergic/Immunologic: negative for hay fever and urticaria      Objective:   Blood pressure 134/81, pulse 64, height 5\' 4"  (1.626 m), weight 228 lb 6 oz (103.6 kg). Body mass index is 39.2 kg/m.  General Appearance:    Alert, cooperative, no distress, appears stated age  Head:    Normocephalic, without obvious abnormality, atraumatic  Eyes:    PERRL,  conjunctiva/corneas clear, EOM's intact, both eyes  Ears:    Normal external ear canals, both ears  Nose:   Nares normal, septum midline, mucosa normal, no drainage or sinus tenderness  Throat:   Lips, mucosa, and tongue normal; teeth and gums normal  Neck:   Supple, symmetrical, trachea midline, no adenopathy; thyroid: no enlargement/tenderness/nodules; no carotid bruit or JVD  Back:     Symmetric, no curvature, ROM normal, no CVA tenderness  Lungs:     Clear to auscultation bilaterally, respirations unlabored  Chest Wall:    No tenderness or deformity   Heart:    Regular rate and rhythm, S1 and S2 normal, no murmur, rub or gallop  Breast Exam:    No tenderness, masses, or nipple abnormality  Abdomen:     Soft, non-tender, bowel  sounds active all four quadrants, no masses, no organomegaly.    Genitalia:    Pelvic:external genitalia normal, small fissure noted at clitoral hood. Rectovaginal septum normal.  Vagina with scant thin white discharge, no odor. Grade 1 cystocele.  Uterus and cervix surgically absent Adnexae non-palpable, nontender bilaterally.   Rectal:    Normal external sphincter.  No hemorrhoids appreciated. Internal exam not done.   Extremities:   Extremities normal, atraumatic, no cyanosis or edema  Pulses:   2+ and symmetric all extremities  Skin:   Skin color, texture, turgor normal, no rashes or lesions  Lymph nodes:   Cervical, supraclavicular, and axillary nodes normal  Neurologic:   CNII-XII intact, normal strength, sensation and reflexes throughout   .  Labs:  Reviewed in Care Everywhere   Assessment:   1. Well woman exam with routine gynecological exam   2. Vulvar fissure   3. Obesity (BMI 30-39.9)   4. Family history of colon cancer      Plan:  Blood tests: Labs done by PCP. Breast self exam technique reviewed and patient encouraged to perform self-exam monthly. Contraception: status post hysterectomy. Discussed healthy lifestyle modifications. Routine  preventative health maintenance measures emphasized: Exercise/Diet/Weight control, Alcohol/Substance use risks and Stress Management Mammogram  UTD.  Pap smear  No longer needed . Colonoscopy up to date. To repeat every 5 years due to family h/o colon cancer, due in 4 years.  COVID vaccination status:Declined.  Flu vaccine: Declined Vulvar fissure, advised to use hydrocortisone cream mixed with aloe vera to treat.  Follow up in 1 year for annual exam    Rubie Maid, MD [facility]

## 2021-02-04 ENCOUNTER — Encounter: Payer: Self-pay | Admitting: Obstetrics and Gynecology

## 2021-02-04 ENCOUNTER — Other Ambulatory Visit: Payer: Self-pay

## 2021-02-04 ENCOUNTER — Other Ambulatory Visit: Payer: Self-pay | Admitting: Obstetrics and Gynecology

## 2021-02-04 DIAGNOSIS — N898 Other specified noninflammatory disorders of vagina: Secondary | ICD-10-CM

## 2021-02-04 MED ORDER — FLUCONAZOLE 150 MG PO TABS: 150.0000 mg | ORAL_TABLET | Freq: Once | ORAL | 1 refills | Status: AC

## 2021-02-04 MED ORDER — FLUCONAZOLE 150 MG PO TABS: 150.0000 mg | ORAL_TABLET | Freq: Once | ORAL | 0 refills | Status: DC

## 2021-02-11 ENCOUNTER — Encounter: Payer: Managed Care, Other (non HMO) | Admitting: Obstetrics and Gynecology

## 2021-02-12 ENCOUNTER — Encounter: Payer: Managed Care, Other (non HMO) | Admitting: Obstetrics and Gynecology

## 2021-06-04 ENCOUNTER — Other Ambulatory Visit: Payer: Self-pay | Admitting: Internal Medicine

## 2021-06-04 DIAGNOSIS — Z1231 Encounter for screening mammogram for malignant neoplasm of breast: Secondary | ICD-10-CM

## 2021-08-27 ENCOUNTER — Ambulatory Visit
Admission: RE | Admit: 2021-08-27 | Discharge: 2021-08-27 | Disposition: A | Discharge status: Home / Self Care | Payer: Managed Care, Other (non HMO) | Source: Ambulatory Visit | Attending: Internal Medicine | Admitting: Internal Medicine

## 2021-08-27 DIAGNOSIS — Z1231 Encounter for screening mammogram for malignant neoplasm of breast: Secondary | ICD-10-CM | POA: Insufficient documentation

## 2021-08-30 ENCOUNTER — Telehealth: Payer: Self-pay | Admitting: Obstetrics and Gynecology

## 2021-08-30 ENCOUNTER — Other Ambulatory Visit: Payer: Self-pay | Admitting: Internal Medicine

## 2021-08-30 DIAGNOSIS — N6489 Other specified disorders of breast: Secondary | ICD-10-CM

## 2021-08-30 DIAGNOSIS — R928 Other abnormal and inconclusive findings on diagnostic imaging of breast: Secondary | ICD-10-CM

## 2021-08-30 NOTE — Telephone Encounter (Signed)
Pt states "she needs  order placed for additional breast imagining at [facility] - per [facility]. Pt is requesting a call back as soon as order is placed. [place] instructed her to let them know ASAP so they could work her in. Please return call.

## 2021-09-01 ENCOUNTER — Ambulatory Visit
Admission: RE | Admit: 2021-09-01 | Discharge: 2021-09-01 | Disposition: A | Discharge status: Home / Self Care | Payer: Managed Care, Other (non HMO) | Source: Ambulatory Visit | Attending: Internal Medicine | Admitting: Internal Medicine

## 2021-09-01 DIAGNOSIS — R928 Other abnormal and inconclusive findings on diagnostic imaging of breast: Secondary | ICD-10-CM | POA: Diagnosis present

## 2021-09-01 DIAGNOSIS — N6489 Other specified disorders of breast: Secondary | ICD-10-CM

## 2021-09-02 NOTE — Telephone Encounter (Signed)
Orders have been placed.

## 2021-09-16 ENCOUNTER — Other Ambulatory Visit: Payer: Managed Care, Other (non HMO)

## 2021-11-01 IMAGING — US US ABDOMEN LIMITED
1 series · 12 of 12 positions shown · non-contrast
Comparison: None.

CLINICAL DATA: Abdominal swelling. Redness [REDACTED]. Status post
abscess drainage in [REDACTED].

EXAM:
ULTRASOUND ABDOMEN LIMITED

[Series 1: us abdomen limited · 12 acquisitions, 12 frames shown]
[im 1/12]
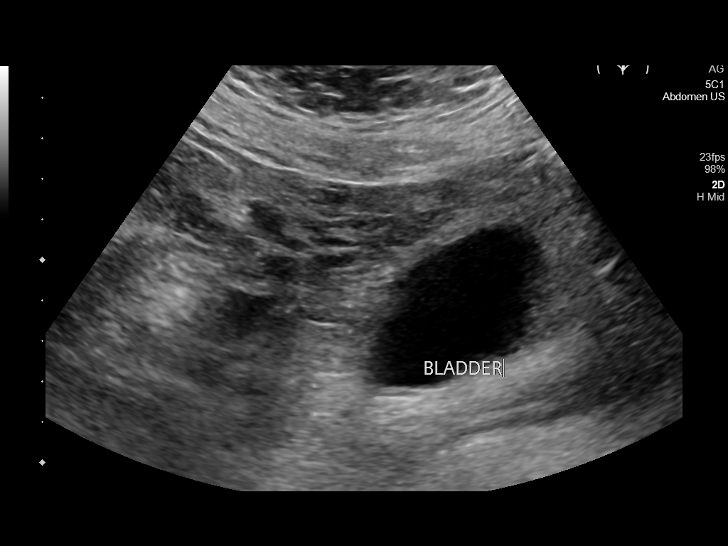
[im 2/12]
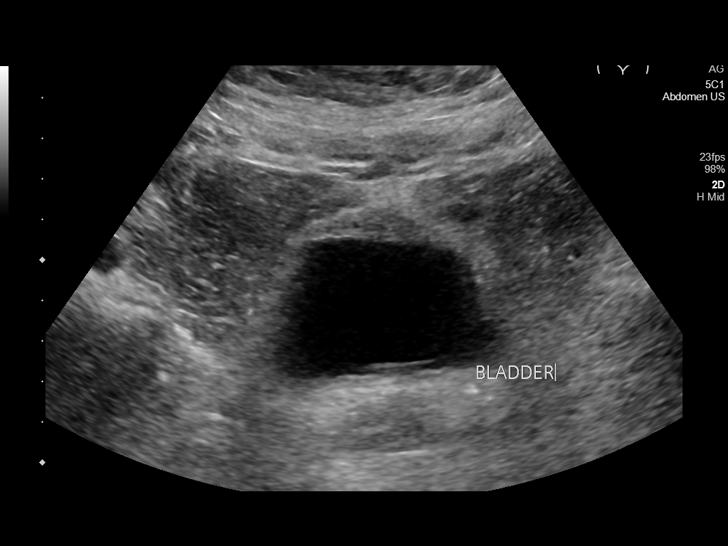
[im 3/12]
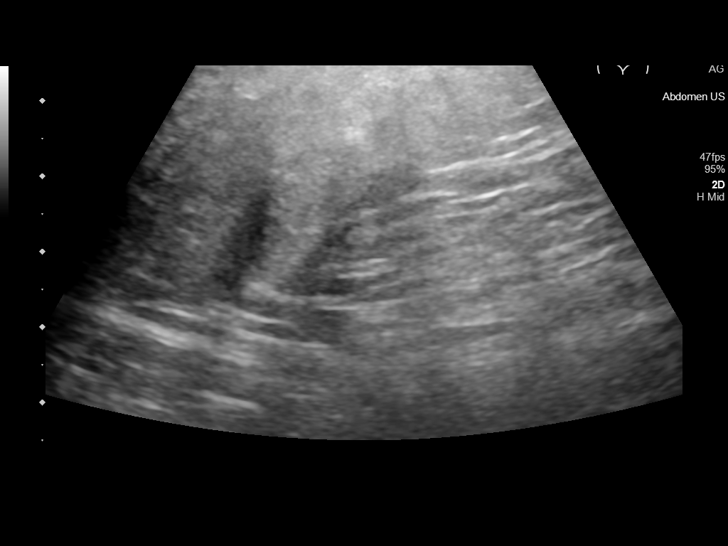
[im 4/12]
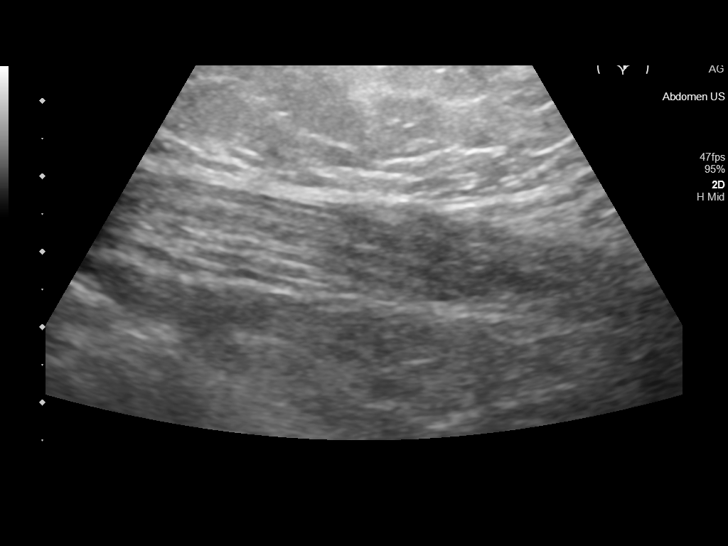
[im 5/12]
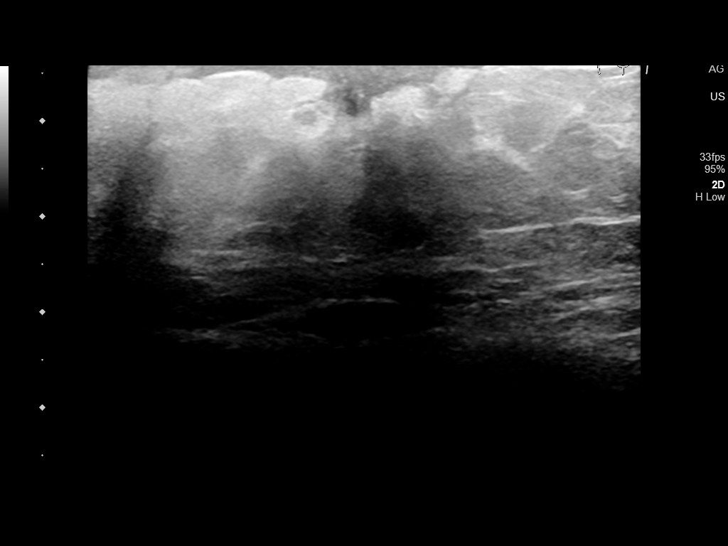
[im 6/12]
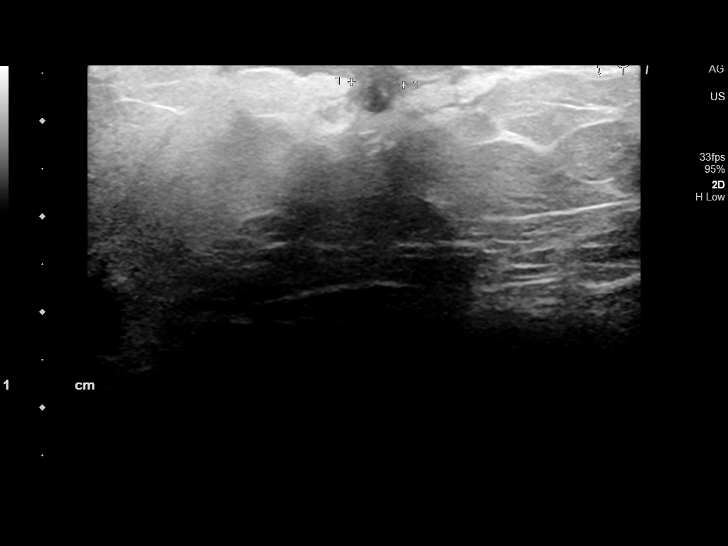
[im 7/12]
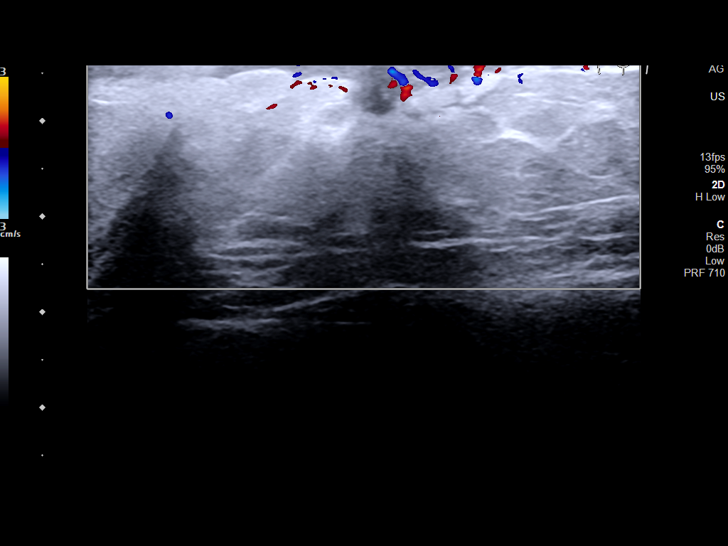
[im 8/12]
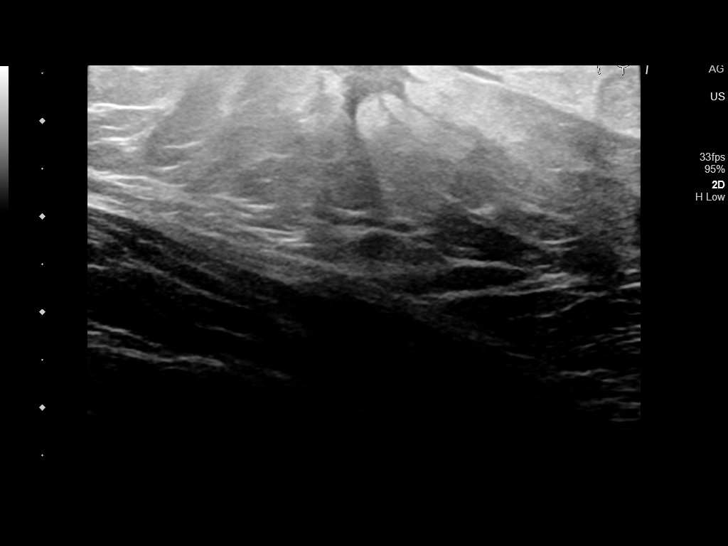
[im 9/12]
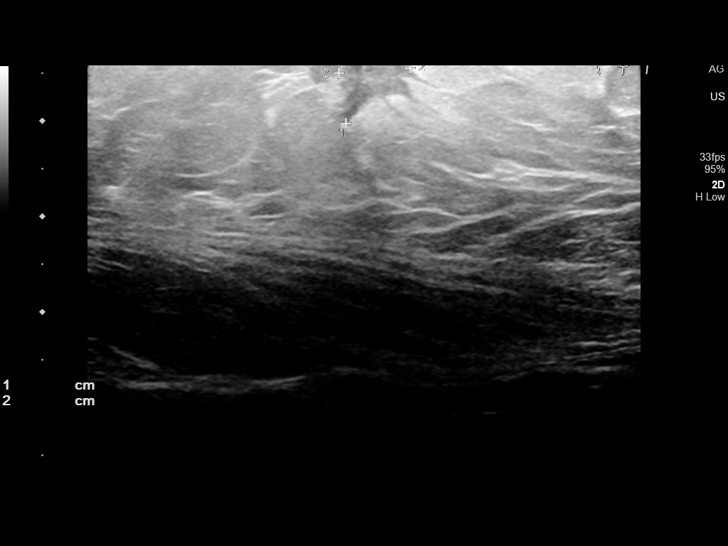
[im 10/12]
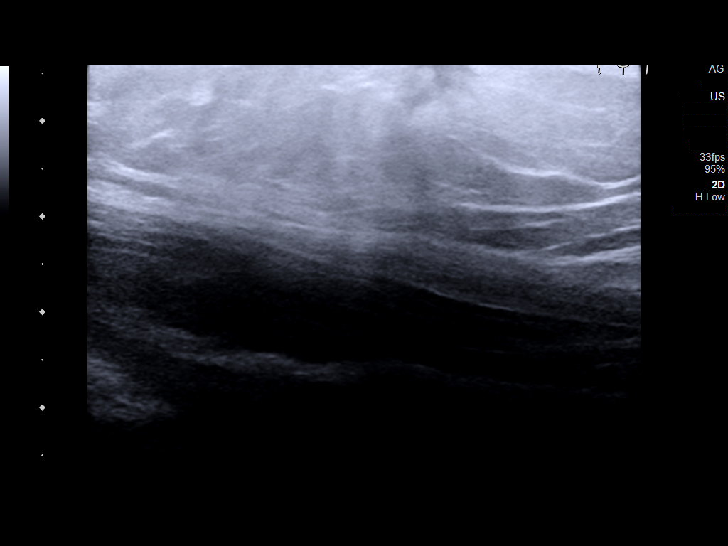
[im 11/12]
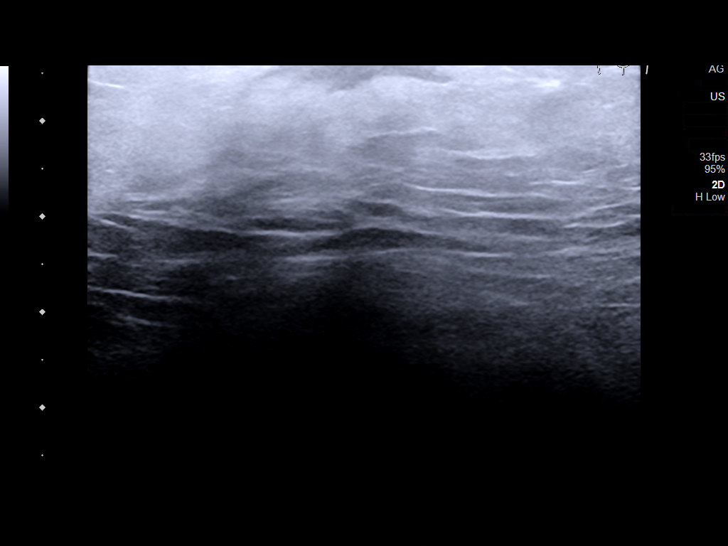
[im 12/12]
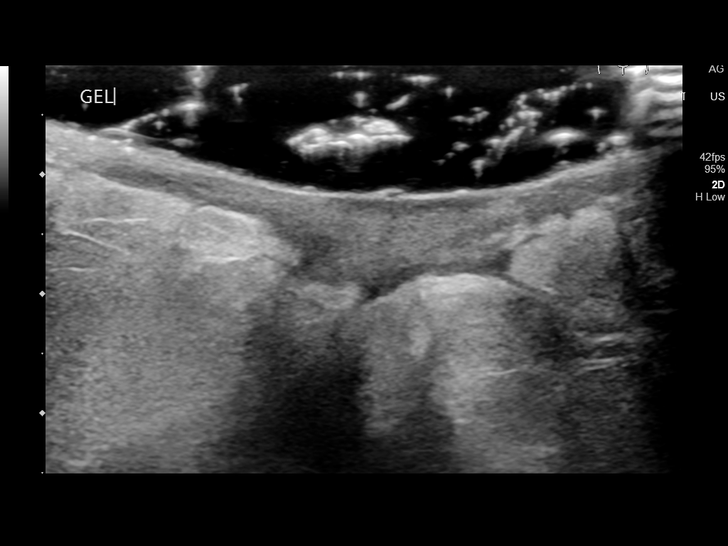

[12 of 12 positions shown; findings below may reference images not displayed]

FINDINGS: At the inferior portion of the incision site, within the lower
abdominal wall, a hypoechoic area measures 1.0 x 0.8 x 0.5 cm.
IMPRESSION: Hypoechoic area about the inferior portion of the incision site.
Considerations include postoperative scarring or phlegmon. No
well-defined fluid collection to suggest abscess or hematoma.

## 2021-11-24 ENCOUNTER — Other Ambulatory Visit: Payer: Self-pay | Admitting: Family Medicine

## 2021-11-24 ENCOUNTER — Ambulatory Visit
Admission: RE | Admit: 2021-11-24 | Discharge: 2021-11-24 | Disposition: A | Discharge status: Home / Self Care | Payer: Managed Care, Other (non HMO) | Source: Ambulatory Visit | Attending: Family Medicine | Admitting: Family Medicine

## 2021-11-24 DIAGNOSIS — R1032 Left lower quadrant pain: Secondary | ICD-10-CM | POA: Insufficient documentation

## 2021-11-24 DIAGNOSIS — K922 Gastrointestinal hemorrhage, unspecified: Secondary | ICD-10-CM

## 2021-11-24 DIAGNOSIS — Z800 Family history of malignant neoplasm of digestive organs: Secondary | ICD-10-CM | POA: Insufficient documentation

## 2021-11-24 MED ORDER — IOHEXOL 300 MG/ML  SOLN
100.0000 mL | Freq: Once | INTRAMUSCULAR | Status: AC | PRN
  Administered 2021-11-24: 100 mL via INTRAVENOUS

## 2021-11-24 MED ORDER — IOHEXOL 9 MG/ML PO SOLN: 500.0000 mL | ORAL | Status: AC

## 2021-12-05 NOTE — H&P (Signed)
Pre-Procedure H&P   Patient ID: Sydney Soto is a 61 y.o. female.  Gastroenterology Provider: Jaynie Collins, DO  Referring Provider: Fransico Setters, NP PCP: Sydney Arbour, MD  Date: 12/06/2021  HPI [name] is a 61 y.o. female who presents today for Colonoscopy for abnormal CT, BRBPR.  Patient noted some rectal bleeding and underwent CT with contrast which demonstrated circumferential wall thickening in the ascending colon near the ileocecal junction.  This was approximately 5.3 cm in length.  The patient denies melena.  Bowel movements occur every 3 to 4 days and are formed.  CT noted a large amount of stool in the ascending colon as well  Most recent lab work hemoglobin 13.2 MCV 92 platelets 329,000 creatinine 0.8 TIBC 485 ferritin 20% sat 22  Patient is on Ozempic which has been held. S/p hysto, chole, and appendctomy  Last colonoscopy October 2021 with internal hemorrhoids otherwise normal.  Normal colonoscopy in May 2016 as well.  Past Medical History:  Diagnosis Date   Anxiety    Dysphagia, unspecified    GERD (gastroesophageal reflux disease)    Glaucoma    History of IBS    WITH CONSTIPATION   Hyperlipidemia    Hypertension    Hypothyroidism    OCD (obsessive compulsive disorder)    Pre-diabetes    DIET CONTROLLED   Pulmonary embolism (HCC)    Bilateral    Thyroid disease     Past Surgical History:  Procedure Laterality Date   ABDOMINAL HYSTERECTOMY     APPENDECTOMY     BACK SURGERY  09/06/2019   CHOLECYSTECTOMY     COLONOSCOPY N/A 07/02/2014   Procedure: COLONOSCOPY;  Surgeon: Scot Jun, MD;  Location: [facility] ENDOSCOPY;  Service: Endoscopy;  Laterality: N/A;   COLONOSCOPY WITH PROPOFOL N/A 11/25/2019   Procedure: COLONOSCOPY WITH PROPOFOL;  Surgeon: Toledo, Boykin Nearing, MD;  Location: [facility] ENDOSCOPY;  Service: Endoscopy;  Laterality: N/A;   DILATION AND CURETTAGE OF UTERUS     ESOPHAGOGASTRODUODENOSCOPY N/A 07/02/2014    Procedure: ESOPHAGOGASTRODUODENOSCOPY (EGD);  Surgeon: Scot Jun, MD;  Location: [facility] ENDOSCOPY;  Service: Endoscopy;  Laterality: N/A;   TUBAL LIGATION      Family History CRC- Mother (28), Maternal Uncle (70-80); Maternal Cousin (30s) No other h/o GI disease or malignancy  Review of Systems  Constitutional:  Negative for activity change, appetite change, chills, diaphoresis, fatigue, fever and unexpected weight change.  HENT:  Negative for trouble swallowing and voice change.   Respiratory:  Negative for shortness of breath and wheezing.   Cardiovascular:  Negative for chest pain, palpitations and leg swelling.  Gastrointestinal:  Positive for blood in stool. Negative for abdominal distention, abdominal pain, anal bleeding, constipation, diarrhea, nausea, rectal pain and vomiting.  Musculoskeletal:  Negative for arthralgias and myalgias.  Skin:  Negative for color change and pallor.  Neurological:  Negative for dizziness, syncope and weakness.  Psychiatric/Behavioral:  Negative for confusion.   All other systems reviewed and are negative.    Medications No current facility-administered medications on file prior to encounter.   Current Outpatient Medications on File Prior to Encounter  Medication Sig Dispense Refill   aspirin 325 MG tablet Take 325 mg by mouth daily.     bisoprolol-hydrochlorothiazide (ZIAC) 5-6.25 MG tablet Take 1 tablet by mouth daily.     buPROPion (WELLBUTRIN SR) 150 MG 12 hr tablet TK 1 T PO 2 TIMES D  3   cyanocobalamin (,VITAMIN B-12,) 1000 MCG/ML injection  Inject 1 mL into the muscle every 30 (thirty) days.     DULoxetine (CYMBALTA) 30 MG capsule Take 30-60 mg by mouth 2 (two) times daily. 2 cap in the morning and 1 cap at night     GLIPIZIDE PO Take by mouth.     levothyroxine (SYNTHROID) 50 MCG tablet Take 50 mcg by mouth daily before breakfast.     Semaglutide,0.25 or 0.5MG /DOS, (OZEMPIC, 0.25 OR 0.5 MG/DOSE,) 2 MG/1.5ML SOPN Inject into the  skin. weekly      Pertinent medications related to GI and procedure were reviewed by me with the patient prior to the procedure   Current Facility-Administered Medications:    0.9 %  sodium chloride infusion, , Intravenous, Continuous, Sydney Helling, DO, Last Rate: 20 mL/hr at 12/06/21 1330, New Bag at 12/06/21 1330   lidocaine (PF) (XYLOCAINE) 1 % injection, , , ,       Allergies  Allergen Reactions   Erythromycin Nausea Only   Allergies were reviewed by me prior to the procedure  Objective   Body mass index is 33.81 kg/m. Vitals:   12/06/21 1314  BP: 137/85  Pulse: 74  Resp: 18  Temp: (!) 96.3 F (35.7 C)  TempSrc: Tympanic  SpO2: 98%  Weight: 89.4 kg  Height: 5\' 4"  (1.626 m)     Physical Exam Vitals and nursing note reviewed.  Constitutional:      General: She is not in acute distress.    Appearance: Normal appearance. She is obese. She is not ill-appearing, toxic-appearing or diaphoretic.  HENT:     Head: Normocephalic and atraumatic.     Nose: Nose normal.     Mouth/Throat:     Mouth: Mucous membranes are moist.     Pharynx: Oropharynx is clear.  Eyes:     General: No scleral icterus.    Extraocular Movements: Extraocular movements intact.  Cardiovascular:     Rate and Rhythm: Normal rate and regular rhythm.     Heart sounds: Normal heart sounds. No murmur heard.    No friction rub. No gallop.  Pulmonary:     Effort: Pulmonary effort is normal. No respiratory distress.     Breath sounds: Normal breath sounds. No wheezing, rhonchi or rales.  Abdominal:     General: Bowel sounds are normal. There is no distension.     Palpations: Abdomen is soft.     Tenderness: There is no abdominal tenderness. There is no guarding or rebound.  Musculoskeletal:     Cervical back: Neck supple.     Right lower leg: No edema.     Left lower leg: No edema.  Skin:    General: Skin is warm and dry.     Coloration: Skin is not jaundiced or pale.  Neurological:      General: No focal deficit present.     Mental Status: She is alert and oriented to person, place, and time. Mental status is at baseline.  Psychiatric:        Mood and Affect: Mood normal.        Behavior: Behavior normal.        Thought Content: Thought content normal.        Judgment: Judgment normal.      Assessment:  Ms. AKEIRA LAHM is a 61 y.o. female  who presents today for Colonoscopy for abnormal CT, BRBPR.  Plan:  Colonoscopy with possible intervention today  Colonoscopy with possible biopsy, control of bleeding, polypectomy, and interventions as necessary  has been discussed with the patient/patient representative. Informed consent was obtained from the patient/patient representative after explaining the indication, nature, and risks of the procedure including but not limited to death, bleeding, perforation, missed neoplasm/lesions, cardiorespiratory compromise, and reaction to medications. Opportunity for questions was given and appropriate answers were provided. Patient/patient representative has verbalized understanding is amenable to undergoing the procedure.   Sydney Helling, DO  [facility]astroenter[facility]  Portions of the record may have been created with voice recognition software. Occasional wrong-word or 'sound-a-like' substitutions may have occurred due to the inherent limitations of voice recognition software.  Read the chart carefully and recognize, using context, where substitutions may have occurred.

## 2021-12-06 ENCOUNTER — Ambulatory Visit (HOSPITAL_COMMUNITY): Admit: 2021-12-06 | Payer: Managed Care, Other (non HMO) | Admitting: Gastroenterology

## 2021-12-06 ENCOUNTER — Ambulatory Visit
Admission: RE | Admit: 2021-12-06 | Discharge: 2021-12-06 | Disposition: A | Discharge status: Home / Self Care | Payer: Managed Care, Other (non HMO) | Attending: Gastroenterology | Admitting: Gastroenterology

## 2021-12-06 ENCOUNTER — Encounter: Payer: Self-pay | Admitting: Gastroenterology

## 2021-12-06 ENCOUNTER — Ambulatory Visit: Payer: Managed Care, Other (non HMO) | Admitting: Anesthesiology

## 2021-12-06 ENCOUNTER — Encounter
Admission: RE | Disposition: A | Discharge status: Home / Self Care | Payer: Self-pay | Source: Home / Self Care | Attending: Gastroenterology

## 2021-12-06 ENCOUNTER — Encounter (HOSPITAL_COMMUNITY): Payer: Self-pay

## 2021-12-06 DIAGNOSIS — E039 Hypothyroidism, unspecified: Secondary | ICD-10-CM | POA: Insufficient documentation

## 2021-12-06 DIAGNOSIS — Z7985 Long-term (current) use of injectable non-insulin antidiabetic drugs: Secondary | ICD-10-CM | POA: Diagnosis not present

## 2021-12-06 DIAGNOSIS — K625 Hemorrhage of anus and rectum: Secondary | ICD-10-CM | POA: Insufficient documentation

## 2021-12-06 DIAGNOSIS — F419 Anxiety disorder, unspecified: Secondary | ICD-10-CM | POA: Insufficient documentation

## 2021-12-06 DIAGNOSIS — E669 Obesity, unspecified: Secondary | ICD-10-CM | POA: Insufficient documentation

## 2021-12-06 DIAGNOSIS — Z86718 Personal history of other venous thrombosis and embolism: Secondary | ICD-10-CM | POA: Insufficient documentation

## 2021-12-06 DIAGNOSIS — Z6833 Body mass index (BMI) 33.0-33.9, adult: Secondary | ICD-10-CM | POA: Diagnosis not present

## 2021-12-06 DIAGNOSIS — G4730 Sleep apnea, unspecified: Secondary | ICD-10-CM | POA: Diagnosis not present

## 2021-12-06 DIAGNOSIS — Z86711 Personal history of pulmonary embolism: Secondary | ICD-10-CM | POA: Diagnosis not present

## 2021-12-06 DIAGNOSIS — K635 Polyp of colon: Secondary | ICD-10-CM | POA: Insufficient documentation

## 2021-12-06 DIAGNOSIS — K648 Other hemorrhoids: Secondary | ICD-10-CM | POA: Diagnosis not present

## 2021-12-06 DIAGNOSIS — R933 Abnormal findings on diagnostic imaging of other parts of digestive tract: Secondary | ICD-10-CM | POA: Insufficient documentation

## 2021-12-06 DIAGNOSIS — R7303 Prediabetes: Secondary | ICD-10-CM | POA: Insufficient documentation

## 2021-12-06 DIAGNOSIS — I10 Essential (primary) hypertension: Secondary | ICD-10-CM | POA: Insufficient documentation

## 2021-12-06 HISTORY — PX: COLONOSCOPY WITH PROPOFOL: SHX5780

## 2021-12-06 SURGERY — COLONOSCOPY WITH PROPOFOL
Anesthesia: General

## 2021-12-06 MED ORDER — DEXMEDETOMIDINE HCL IN NACL 200 MCG/50ML IV SOLN
INTRAVENOUS | Status: DC | PRN
  Administered 2021-12-06: 12 ug via INTRAVENOUS
  Administered 2021-12-06: 8 ug via INTRAVENOUS

## 2021-12-06 MED ORDER — PHENYLEPHRINE HCL (PRESSORS) 10 MG/ML IV SOLN
INTRAVENOUS | Status: DC | PRN
  Administered 2021-12-06: 160 ug via INTRAVENOUS

## 2021-12-06 MED ORDER — LIDOCAINE HCL (CARDIAC) PF 100 MG/5ML IV SOSY
PREFILLED_SYRINGE | INTRAVENOUS | Status: DC | PRN
  Administered 2021-12-06: 50 mg via INTRAVENOUS

## 2021-12-06 MED ORDER — SODIUM CHLORIDE 0.9 % IV SOLN: INTRAVENOUS | Status: DC

## 2021-12-06 MED ORDER — PROPOFOL 500 MG/50ML IV EMUL
INTRAVENOUS | Status: DC | PRN
  Administered 2021-12-06: 120 ug/kg/min via INTRAVENOUS

## 2021-12-06 MED ORDER — LIDOCAINE HCL (PF) 1 % IJ SOLN
INTRAMUSCULAR | Status: AC
  Filled 2021-12-06: qty 2

## 2021-12-06 MED ORDER — PROPOFOL 10 MG/ML IV BOLUS
INTRAVENOUS | Status: DC | PRN
  Administered 2021-12-06: 80 mg via INTRAVENOUS

## 2021-12-06 NOTE — Anesthesia Postprocedure Evaluation (Signed)
Anesthesia Post Note  Patient: [employer]  Procedure(s) Performed: COLONOSCOPY WITH PROPOFOL  Patient location during evaluation: PACU Anesthesia Type: General Level of consciousness: awake and alert, oriented and patient cooperative Pain management: pain level controlled Vital Signs Assessment: post-procedure vital signs reviewed and stable Respiratory status: spontaneous breathing, nonlabored ventilation and respiratory function stable Cardiovascular status: blood pressure returned to baseline and stable Postop Assessment: adequate PO intake Anesthetic complications: no   No notable events documented.   Last Vitals:  Vitals:   12/06/21 1436 12/06/21 1446  BP: (!) 81/63 98/65  Pulse: 73 67  Resp: 16 13  Temp:    SpO2: 97% 96%    Last Pain:  Vitals:   12/06/21 1436  TempSrc:   PainSc: Asleep                 Darrin Nipper

## 2021-12-06 NOTE — Op Note (Signed)
[facility] Gastroenterology Patient Name: Sydney Soto Procedure Date: 12/06/2021 1:41 PM MRN: [id number] Account #: [id number] Date of Birth: [birth year] Admit Type: Outpatient Age: 61 Room: [facility] ENDO ROOM 2 Gender: Female Note Status: Finalized Instrument Name: Colonscope [id number] Procedure:             Colonoscopy Indications:           Abnormal CT of the GI tract Providers:             Rueben Bash, DO Referring MD:          Leonie Douglas. Doy Hutching, MD (Referring MD) Medicines:             Monitored Anesthesia Care Complications:         No immediate complications. Estimated blood loss:                         Minimal. Procedure:             Pre-Anesthesia Assessment:                        - Prior to the procedure, a History and Physical was                         performed, and patient medications and allergies were                         reviewed. The patient is competent. The risks and                         benefits of the procedure and the sedation options and                         risks were discussed with the patient. All questions                         were answered and informed consent was obtained.                         Patient identification and proposed procedure were                         verified by the physician, the nurse, the anesthetist                         and the technician in the endoscopy suite. Mental                         Status Examination: alert and oriented. Airway                         Examination: normal oropharyngeal airway and neck                         mobility. Respiratory Examination: clear to                         auscultation. CV Examination: RRR, no murmurs, no S3  or S4. Prophylactic Antibiotics: The patient does not                         require prophylactic antibiotics. Prior                         Anticoagulants: The patient has taken no anticoagulant                          or antiplatelet agents. ASA Grade Assessment: II - A                         patient with mild systemic disease. After reviewing                         the risks and benefits, the patient was deemed in                         satisfactory condition to undergo the procedure. The                         anesthesia plan was to use monitored anesthesia care                         (MAC). Immediately prior to administration of                         medications, the patient was re-assessed for adequacy                         to receive sedatives. The heart rate, respiratory                         rate, oxygen saturations, blood pressure, adequacy of                         pulmonary ventilation, and response to care were                         monitored throughout the procedure. The physical                         status of the patient was re-assessed after the                         procedure.                        After obtaining informed consent, the colonoscope was                         passed under direct vision. Throughout the procedure,                         the patient's blood pressure, pulse, and oxygen                         saturations were monitored continuously. The  Colonoscope was introduced through the anus and                         advanced to the the terminal ileum, with                         identification of the appendiceal orifice and IC                         valve. The colonoscopy was performed without                         difficulty. The patient tolerated the procedure well.                         The quality of the bowel preparation was evaluated                         using the BBPS [facility] Bowel Preparation Scale) with                         scores of: Right Colon = 3, Transverse Colon = 3 and                         Left Colon = 3 (entire mucosa seen well with no                         residual staining,  small fragments of stool or opaque                         liquid). The total BBPS score equals 9. The terminal                         ileum, ileocecal valve, appendiceal orifice, and                         rectum were photographed. Findings:      Hemorrhoids were found on perianal exam.      Non-bleeding external and internal hemorrhoids were found during       retroflexion and during perianal exam. Estimated blood loss: none.      The terminal ileum appeared normal. Estimated blood loss: none.      Normal mucosa was found in the proximal ascending colon and in the       cecum. No sign of lesion that was reported on CT scan. Estimated blood       loss: none.      Retroflexion in the right colon was performed.      A 1 to 2 mm polyp was found in the descending colon. The polyp was       sessile. The polyp was removed with a jumbo cold forceps. Resection and       retrieval were complete. Estimated blood loss was minimal.      The exam was otherwise without abnormality on direct and retroflexion       views. Impression:            - Hemorrhoids found on perianal exam.                        -  Non-bleeding external and internal hemorrhoids.                        - The examined portion of the ileum was normal.                        - Normal mucosa in the proximal ascending colon and in                         the cecum.                        - One 1 to 2 mm polyp in the descending colon, removed                         with a jumbo cold forceps. Resected and retrieved.                        - The examination was otherwise normal on direct and                         retroflexion views. Recommendation:        - Patient has a contact number available for                         emergencies. The signs and symptoms of potential                         delayed complications were discussed with the patient.                         Return to normal activities tomorrow. Written                          discharge instructions were provided to the patient.                        - Discharge patient to home.                        - Resume previous diet.                        - Continue present medications.                        - Await pathology results.                        - Repeat colonoscopy in 5 years for surveillance.                        - Return to GI office as previously scheduled.                        - The findings and recommendations were discussed with                         the patient.                        -  The findings and recommendations were discussed with                         the patient's family. Procedure Code(s):     --- Professional ---                        [phone], Colonoscopy, flexible; with biopsy, single or                         multiple Diagnosis Code(s):     --- Professional ---                        K64.8, Other hemorrhoids                        D12.4, Benign neoplasm of descending colon                        R93.3, Abnormal findings on diagnostic imaging of                         other parts of digestive tract CPT copyright 2022 American [employer] Association. All rights reserved. The codes documented in this report are preliminary and upon coder review may  be revised to meet current compliance requirements. Attending Participation:      I personally performed the entire procedure. Volney American, DO Annamaria Helling DO, DO 12/06/2021 2:27:31 PM This report has been signed electronically. Number of Addenda: 0 Note Initiated On: 12/06/2021 1:41 PM Scope Withdrawal Time: 0 hours 14 minutes 19 seconds  Total Procedure Duration: 0 hours 18 minutes 16 seconds  Estimated Blood Loss:  Estimated blood loss was minimal.      [facility]

## 2021-12-06 NOTE — Transfer of Care (Signed)
Immediate Anesthesia Transfer of Care Note  Patient: Heyli Leilani Able  Procedure(s) Performed: COLONOSCOPY WITH PROPOFOL  Patient Location: PACU and Endoscopy Unit  Anesthesia Type:General  Level of Consciousness: drowsy and patient cooperative  Airway & Oxygen Therapy: Patient Spontanous Breathing  Post-op Assessment: Report given to RN and Post -op Vital signs reviewed and stable  Post vital signs: Reviewed and stable  Last Vitals:  Vitals Value Taken Time  BP 91/64 12/06/21 1426  Temp 35.9 C 12/06/21 1426  Pulse 77 12/06/21 1428  Resp 15 12/06/21 1428  SpO2 98 % 12/06/21 1428  Vitals shown include unvalidated device data.  Last Pain:  Vitals:   12/06/21 1426  TempSrc: Temporal  PainSc: Asleep         Complications: No notable events documented.

## 2021-12-06 NOTE — Interval H&P Note (Signed)
History and Physical Interval Note: Preprocedure H&P from 12/06/21  was reviewed and there was no interval change after seeing and examining the patient.  Written consent was obtained from the patient after discussion of risks, benefits, and alternatives. Patient has consented to proceed with Colonoscopy with possible intervention    12/06/2021 1:55 PM  [place]  has presented today for surgery, with the diagnosis of ABNORMAL CT SCAN.  The various methods of treatment have been discussed with the patient and family. After consideration of risks, benefits and other options for treatment, the patient has consented to  Procedure(s): COLONOSCOPY WITH PROPOFOL (N/A) as a surgical intervention.  The patient's history has been reviewed, patient examined, no change in status, stable for surgery.  I have reviewed the patient's chart and labs.  Questions were answered to the patient's satisfaction.     Annamaria Helling

## 2021-12-06 NOTE — Anesthesia Preprocedure Evaluation (Addendum)
Anesthesia Evaluation  Patient identified by MRN, date of birth, ID band Patient awake    Reviewed: Allergy & Precautions, NPO status , Patient's Chart, lab work & pertinent test results  History of Anesthesia Complications Negative for: history of anesthetic complications  Airway Mallampati: III   Neck ROM: Full    Dental no notable dental hx.    Pulmonary sleep apnea ,    Pulmonary exam normal breath sounds clear to auscultation       Cardiovascular hypertension, Normal cardiovascular exam Rhythm:Regular Rate:Normal  Hx DVT/PE   Neuro/Psych PSYCHIATRIC DISORDERS (OCD) Anxiety negative neurological ROS     GI/Hepatic GERD  ,  Endo/Other  Hypothyroidism Prediabetes, obesity  Renal/GU negative Renal ROS     Musculoskeletal   Abdominal   Peds  Hematology negative hematology ROS (+)   Anesthesia Other Findings Last Ozempic dose 11/26/21.  Reproductive/Obstetrics                            Anesthesia Physical Anesthesia Plan  ASA: 2  Anesthesia Plan: General   Post-op Pain Management:    Induction: Intravenous  PONV Risk Score and Plan: 3 and Propofol infusion, TIVA and Treatment may vary due to age or medical condition  Airway Management Planned: Natural Airway  Additional Equipment:   Intra-op Plan:   Post-operative Plan:   Informed Consent: I have reviewed the patients History and Physical, chart, labs and discussed the procedure including the risks, benefits and alternatives for the proposed anesthesia with the patient or authorized representative who has indicated his/her understanding and acceptance.       Plan Discussed with: CRNA  Anesthesia Plan Comments: (LMA/GETA backup discussed.  Patient consented for risks of anesthesia including but not limited to:  - adverse reactions to medications - damage to eyes, teeth, lips or other oral mucosa - nerve damage due to  positioning  - sore throat or hoarseness - damage to heart, brain, nerves, lungs, other parts of body or loss of life  Informed patient about role of CRNA in peri- and intra-operative care.  Patient voiced understanding.)        Anesthesia Quick Evaluation

## 2021-12-07 ENCOUNTER — Encounter: Payer: Self-pay | Admitting: Gastroenterology

## 2021-12-08 LAB — SURGICAL PATHOLOGY

## 2022-01-24 NOTE — Progress Notes (Deleted)
ANNUAL PREVENTATIVE CARE GYNECOLOGY  ENCOUNTER NOTE  Subjective:       Sydney Soto is a 61 y.o. G58P2002 female here for a routine annual gynecologic exam. The patient {is/is not/has never been:13135} sexually active. The patient {is/is not:13135} taking hormone replacement therapy. {post-men bleed:13152::"Patient denies post-menopausal vaginal bleeding."} The patient wears seatbelts: {yes/no:311178}. The patient participates in regular exercise: {yes/no/not asked:9010}. Has the patient ever been transfused or tattooed?: {yes/no/not asked:9010}. The patient reports that there {is/is not:9024} domestic violence in her life.  Current complaints: 1.  ***    Gynecologic History No LMP recorded. Patient has had a hysterectomy. Contraception: status post hysterectomy Last Pap: 10/26/2018 . Results were: normal (NO LONGER NEEDED) Last mammogram: 08/27/2021. Results were: Further evaluation is suggested for possible asymmetry in the right breast Last Colonoscopy: 12/06/2021: 10 years  Last Dexa Scan: Never done   Obstetric History OB History  Gravida Para Term Preterm AB Living  2 2 2     2   SAB IAB Ectopic Multiple Live Births          2    # Outcome Date GA Lbr Len/2nd Weight Sex Delivery Anes PTL Lv  2 Term 1997    F Vag-Spont   LIV  1 Term 12    F Vag-Spont   LIV    Past Medical History:  Diagnosis Date   Anxiety    Dysphagia, unspecified    GERD (gastroesophageal reflux disease)    Glaucoma    History of IBS    WITH CONSTIPATION   Hyperlipidemia    Hypertension    Hypothyroidism    OCD (obsessive compulsive disorder)    Pre-diabetes    DIET CONTROLLED   Pulmonary embolism (HCC)    Bilateral    Thyroid disease     Family History  Problem Relation Age of Onset   Osteoarthritis Mother    Colon cancer Mother    Osteoarthritis Father    Diabetes Father    Thyroid disease Sister    Diabetes Brother    Breast cancer Neg Hx    Ovarian cancer Neg Hx      Past Surgical History:  Procedure Laterality Date   ABDOMINAL HYSTERECTOMY     APPENDECTOMY     BACK SURGERY  09/06/2019   CHOLECYSTECTOMY     COLONOSCOPY N/A 07/02/2014   Procedure: COLONOSCOPY;  Surgeon: 07/04/2014, MD;  Location: [facility] ENDOSCOPY;  Service: Endoscopy;  Laterality: N/A;   COLONOSCOPY WITH PROPOFOL N/A 11/25/2019   Procedure: COLONOSCOPY WITH PROPOFOL;  Surgeon: Toledo, 11/27/2019, MD;  Location: [facility] ENDOSCOPY;  Service: Endoscopy;  Laterality: N/A;   COLONOSCOPY WITH PROPOFOL N/A 12/06/2021   Procedure: COLONOSCOPY WITH PROPOFOL;  Surgeon: 12/08/2021, DO;  Location: [facility] ENDOSCOPY;  Service: Gastroenterology;  Laterality: N/A;   DILATION AND CURETTAGE OF UTERUS     ESOPHAGOGASTRODUODENOSCOPY N/A 07/02/2014   Procedure: ESOPHAGOGASTRODUODENOSCOPY (EGD);  Surgeon: 07/04/2014, MD;  Location: [facility] ENDOSCOPY;  Service: Endoscopy;  Laterality: N/A;   TUBAL LIGATION      Social History   Socioeconomic History   Marital status: Married    Spouse name: Not on file   Number of children: Not on file   Years of education: Not on file   Highest education level: Not on file  Occupational History   Not on file  Tobacco Use   Smoking status: Never   Smokeless tobacco: Never  Vaping Use   Vaping Use: Never used  Substance  and Sexual Activity   Alcohol use: No   Drug use: No   Sexual activity: Not Currently    Birth control/protection: Surgical  Other Topics Concern   Not on file  Social History Narrative   Not on file   Social Determinants of Health   Financial Resource Strain: Not on file  Food Insecurity: Not on file  Transportation Needs: Not on file  Physical Activity: Not on file  Stress: Not on file  Social Connections: Not on file  Intimate Partner Violence: Not on file    Current Outpatient Medications on File Prior to Visit  Medication Sig Dispense Refill   aspirin 325 MG tablet Take 325 mg by mouth daily.      bisoprolol-hydrochlorothiazide (ZIAC) 5-6.25 MG tablet Take 1 tablet by mouth daily.     buPROPion (WELLBUTRIN SR) 150 MG 12 hr tablet TK 1 T PO 2 TIMES D  3   cyanocobalamin (,VITAMIN B-12,) 1000 MCG/ML injection Inject 1 mL into the muscle every 30 (thirty) days.     DULoxetine (CYMBALTA) 30 MG capsule Take 30-60 mg by mouth 2 (two) times daily. 2 cap in the morning and 1 cap at night     GLIPIZIDE PO Take by mouth.     levothyroxine (SYNTHROID) 50 MCG tablet Take 50 mcg by mouth daily before breakfast.     Semaglutide,0.25 or 0.5MG /DOS, (OZEMPIC, 0.25 OR 0.5 MG/DOSE,) 2 MG/1.5ML SOPN Inject into the skin. weekly     No current facility-administered medications on file prior to visit.    Allergies  Allergen Reactions   Erythromycin Nausea Only      Review of Systems ROS Review of Systems - General ROS: negative for - chills, fatigue, fever, hot flashes, night sweats, weight gain or weight loss Psychological ROS: negative for - anxiety, decreased libido, depression, mood swings, physical abuse or sexual abuse Ophthalmic ROS: negative for - blurry vision, eye pain or loss of vision ENT ROS: negative for - headaches, hearing change, visual changes or vocal changes Allergy and Immunology ROS: negative for - hives, itchy/watery eyes or seasonal allergies Hematological and Lymphatic ROS: negative for - bleeding problems, bruising, swollen lymph nodes or weight loss Endocrine ROS: negative for - galactorrhea, hair pattern changes, hot flashes, malaise/lethargy, mood swings, palpitations, polydipsia/polyuria, skin changes, temperature intolerance or unexpected weight changes Breast ROS: negative for - new or changing breast lumps or nipple discharge Respiratory ROS: negative for - cough or shortness of breath Cardiovascular ROS: negative for - chest pain, irregular heartbeat, palpitations or shortness of breath Gastrointestinal ROS: no abdominal pain, change in bowel habits, or black or  bloody stools Genito-Urinary ROS: no dysuria, trouble voiding, or hematuria Musculoskeletal ROS: negative for - joint pain or joint stiffness Neurological ROS: negative for - bowel and bladder control changes Dermatological ROS: negative for rash and skin lesion changes   Objective:   There were no vitals taken for this visit. CONSTITUTIONAL: Well-developed, well-nourished female in no acute distress.  PSYCHIATRIC: Normal mood and affect. Normal behavior. Normal judgment and thought content. NEUROLGIC: Alert and oriented to person, place, and time. Normal muscle tone coordination. No cranial nerve deficit noted. HENT:  Normocephalic, atraumatic, External right and left ear normal. Oropharynx is clear and moist EYES: Conjunctivae and EOM are normal. Pupils are equal, round, and reactive to light. No scleral icterus.  NECK: Normal range of motion, supple, no masses.  Normal thyroid.  SKIN: Skin is warm and dry. No rash noted. Not diaphoretic. No erythema. No  pallor. CARDIOVASCULAR: Normal heart rate noted, regular rhythm, no murmur. RESPIRATORY: Clear to auscultation bilaterally. Effort and breath sounds normal, no problems with respiration noted. BREASTS: Symmetric in size. No masses, skin changes, nipple drainage, or lymphadenopathy. ABDOMEN: Soft, normal bowel sounds, no distention noted.  No tenderness, rebound or guarding.  BLADDER: Normal PELVIC:  Bladder {:311640}  Urethra: {:311719}  Vulva: {:311722}  Vagina: {:311643}  Cervix: {:311644}  Uterus: {:311718}  Adnexa: {:311645}  RV: {Blank multiple:19196::"External Exam NormaI","No Rectal Masses","Normal Sphincter tone"}  MUSCULOSKELETAL: Normal range of motion. No tenderness.  No cyanosis, clubbing, or edema.  2+ distal pulses. LYMPHATIC: No Axillary, Supraclavicular, or Inguinal Adenopathy.   Labs: Lab Results  Component Value Date   WBC 11.0 06/26/2013   HGB 12.6 06/26/2013   HCT 38.3 06/26/2013   MCV 92 06/26/2013    PLT 241 06/26/2013    Lab Results  Component Value Date   CREATININE 0.98 06/26/2013   BUN 14 06/26/2013   NA 140 06/26/2013   K 3.8 06/26/2013   CL 107 06/26/2013   CO2 27 06/26/2013    No results found for: "ALT", "AST", "GGT", "ALKPHOS", "BILITOT"  No results found for: "CHOL", "HDL", "LDLCALC", "LDLDIRECT", "TRIG", "CHOLHDL"  No results found for: "TSH"  No results found for: "HGBA1C"   Assessment:   No diagnosis found.   Plan:  Pap: Not needed. Mammogram:  UTD Colon Screening:   UTD Labs:  Ordered Routine preventative health maintenance measures emphasized: Exercise/Diet/Weight control, Tobacco Warnings, Alcohol/Substance use risks, Stress Management, Peer Pressure Issues, and Safe Sex COVID Vaccination status: Return to Clinic - 1 Year   Hildred Laser, MD [facility]

## 2022-01-25 ENCOUNTER — Ambulatory Visit: Payer: Managed Care, Other (non HMO) | Admitting: Obstetrics and Gynecology

## 2022-04-05 NOTE — Progress Notes (Unsigned)
ANNUAL PREVENTATIVE CARE GYNECOLOGY  ENCOUNTER NOTE  Subjective:       Sydney Soto is a 62 y.o. G31P2002 female here for a routine annual gynecologic exam. The patient is sexually active. The patient {is/is not:13135} taking hormone replacement therapy. {post-men bleed:13152::"Patient denies post-menopausal vaginal bleeding."} The patient wears seatbelts: {yes/no:311178}. The patient participates in regular exercise: {yes/no/not asked:9010}. Has the patient ever been transfused or tattooed?: {yes/no/not asked:9010}. The patient reports that there {is/is not:9024} domestic violence in her life.  Current complaints: 1.  ***    Gynecologic History No LMP recorded. Patient has had a hysterectomy. Contraception: {method:5051} Last Pap: ***. Results were: {norm/abn:16337} Last mammogram: 09/01/2021. Results were: normal Last Colonoscopy:  Last Dexa Scan:    Obstetric History OB History  Gravida Para Term Preterm AB Living  '2 2 2     2  '$ SAB IAB Ectopic Multiple Live Births          2    # Outcome Date GA Lbr Len/2nd Weight Sex Delivery Anes PTL Lv  2 Term 1997    F Vag-Spont   LIV  1 Term 30    F Vag-Spont   LIV    Past Medical History:  Diagnosis Date   Anxiety    Dysphagia, unspecified    GERD (gastroesophageal reflux disease)    Glaucoma    History of IBS    WITH CONSTIPATION   Hyperlipidemia    Hypertension    Hypothyroidism    OCD (obsessive compulsive disorder)    Pre-diabetes    DIET CONTROLLED   Pulmonary embolism (HCC)    Bilateral    Thyroid disease     Family History  Problem Relation Age of Onset   Osteoarthritis Mother    Colon cancer Mother    Osteoarthritis Father    Diabetes Father    Thyroid disease Sister    Diabetes Brother    Breast cancer Neg Hx    Ovarian cancer Neg Hx     Past Surgical History:  Procedure Laterality Date   ABDOMINAL HYSTERECTOMY     APPENDECTOMY     BACK SURGERY  09/06/2019   CHOLECYSTECTOMY      COLONOSCOPY N/A 07/02/2014   Procedure: COLONOSCOPY;  Surgeon: Manya Silvas, MD;  Location: [facility] ENDOSCOPY;  Service: Endoscopy;  Laterality: N/A;   COLONOSCOPY WITH PROPOFOL N/A 11/25/2019   Procedure: COLONOSCOPY WITH PROPOFOL;  Surgeon: Toledo, Benay Pike, MD;  Location: [facility] ENDOSCOPY;  Service: Endoscopy;  Laterality: N/A;   COLONOSCOPY WITH PROPOFOL N/A 12/06/2021   Procedure: COLONOSCOPY WITH PROPOFOL;  Surgeon: Annamaria Helling, DO;  Location[facility] ENDOSCOPY;  Service: Gastroenterology;  Laterality: N/A;   DILATION AND CURETTAGE OF UTERUS     ESOPHAGOGASTRODUODENOSCOPY N/A 07/02/2014   Procedure: ESOPHAGOGASTRODUODENOSCOPY (EGD);  Surgeon: Manya Silvas, MD;  Location: [facility] ENDOSCOPY;  Service: Endoscopy;  Laterality: N/A;   TUBAL LIGATION      Social History   Socioeconomic History   Marital status: Married    Spouse name: Not on file   Number of children: Not on file   Years of education: Not on file   Highest education level: Not on file  Occupational History   Not on file  Tobacco Use   Smoking status: Never   Smokeless tobacco: Never  Vaping Use   Vaping Use: Never used  Substance and Sexual Activity   Alcohol use: No   Drug use: No   Sexual activity: Not Currently    Birth  control/protection: Surgical  Other Topics Concern   Not on file  Social History Narrative   Not on file   Social Determinants of Health   Financial Resource Strain: Not on file  Food Insecurity: Not on file  Transportation Needs: Not on file  Physical Activity: Not on file  Stress: Not on file  Social Connections: Not on file  Intimate Partner Violence: Not on file    Current Outpatient Medications on File Prior to Visit  Medication Sig Dispense Refill   aspirin 325 MG tablet Take 325 mg by mouth daily.     bisoprolol-hydrochlorothiazide (ZIAC) 5-6.25 MG tablet Take 1 tablet by mouth daily.     buPROPion (WELLBUTRIN SR) 150 MG 12 hr tablet TK 1 T PO 2 TIMES D  3    cyanocobalamin (,VITAMIN B-12,) 1000 MCG/ML injection Inject 1 mL into the muscle every 30 (thirty) days.     DULoxetine (CYMBALTA) 30 MG capsule Take 30-60 mg by mouth 2 (two) times daily. 2 cap in the morning and 1 cap at night     GLIPIZIDE PO Take by mouth.     levothyroxine (SYNTHROID) 50 MCG tablet Take 50 mcg by mouth daily before breakfast.     Semaglutide,0.25 or 0.'5MG'$ /DOS, (OZEMPIC, 0.25 OR 0.5 MG/DOSE,) 2 MG/1.5ML SOPN Inject into the skin. weekly     No current facility-administered medications on file prior to visit.    Allergies  Allergen Reactions   Erythromycin Nausea Only      Review of Systems ROS Review of Systems - General ROS: negative for - chills, fatigue, fever, hot flashes, night sweats, weight gain or weight loss Psychological ROS: negative for - anxiety, decreased libido, depression, mood swings, physical abuse or sexual abuse Ophthalmic ROS: negative for - blurry vision, eye pain or loss of vision ENT ROS: negative for - headaches, hearing change, visual changes or vocal changes Allergy and Immunology ROS: negative for - hives, itchy/watery eyes or seasonal allergies Hematological and Lymphatic ROS: negative for - bleeding problems, bruising, swollen lymph nodes or weight loss Endocrine ROS: negative for - galactorrhea, hair pattern changes, hot flashes, malaise/lethargy, mood swings, palpitations, polydipsia/polyuria, skin changes, temperature intolerance or unexpected weight changes Breast ROS: negative for - new or changing breast lumps or nipple discharge Respiratory ROS: negative for - cough or shortness of breath Cardiovascular ROS: negative for - chest pain, irregular heartbeat, palpitations or shortness of breath Gastrointestinal ROS: no abdominal pain, change in bowel habits, or black or bloody stools Genito-Urinary ROS: no dysuria, trouble voiding, or hematuria Musculoskeletal ROS: negative for - joint pain or joint stiffness Neurological ROS:  negative for - bowel and bladder control changes Dermatological ROS: negative for rash and skin lesion changes   Objective:   There were no vitals taken for this visit. CONSTITUTIONAL: Well-developed, well-nourished female in no acute distress.  PSYCHIATRIC: Normal mood and affect. Normal behavior. Normal judgment and thought content. [place]: Alert and oriented to person, place, and time. Normal muscle tone coordination. No cranial nerve deficit noted. HENT:  Normocephalic, atraumatic, External right and left ear normal. Oropharynx is clear and moist EYES: Conjunctivae and EOM are normal. Pupils are equal, round, and reactive to light. No scleral icterus.  NECK: Normal range of motion, supple, no masses.  Normal thyroid.  SKIN: Skin is warm and dry. No rash noted. Not diaphoretic. No erythema. No pallor. CARDIOVASCULAR: Normal heart rate noted, regular rhythm, no murmur. RESPIRATORY: Clear to auscultation bilaterally. Effort and breath sounds normal, no problems with  respiration noted. BREASTS: Symmetric in size. No masses, skin changes, nipple drainage, or lymphadenopathy. ABDOMEN: Soft, normal bowel sounds, no distention noted.  No tenderness, rebound or guarding.  BLADDER: Normal PELVIC:  Bladder {:311640}  Urethra: {:311719}  Vulva: {:311722}  Vagina: {:311643}  Cervix: {:311644}  Uterus: {:311718}  Adnexa: {:311645}  RV: {Blank multiple:19196::"External Exam NormaI","No Rectal Masses","Normal Sphincter tone"}  MUSCULOSKELETAL: Normal range of motion. No tenderness.  No cyanosis, clubbing, or edema.  2+ distal pulses. LYMPHATIC: No Axillary, Supraclavicular, or Inguinal Adenopathy.   Labs: Lab Results  Component Value Date   WBC 11.0 06/26/2013   HGB 12.6 06/26/2013   HCT 38.3 06/26/2013   MCV 92 06/26/2013   PLT 241 06/26/2013    Lab Results  Component Value Date   CREATININE 0.98 06/26/2013   BUN 14 06/26/2013   NA 140 06/26/2013   K 3.8 06/26/2013   CL 107  06/26/2013   CO2 27 06/26/2013    No results found for: "ALT", "AST", "GGT", "ALKPHOS", "BILITOT"  No results found for: "CHOL", "HDL", "LDLCALC", "LDLDIRECT", "TRIG", "CHOLHDL"  No results found for: "TSH"  No results found for: "HGBA1C"   Assessment:   No diagnosis found.   Plan:  Pap: {Blank multiple:19196::"Pap, Reflex if ASCUS","Pap Co Test","GC/CT NAAT","Not needed","Not done"} Mammogram: {Blank multiple:19196::"***","Ordered","Not Ordered","Not Indicated"} Colon Screening:  {Blank multiple:19196::"***","Ordered","Not Ordered","Not Indicated"} Labs: {Blank multiple:19196::"Lipid 1","FBS","TSH","Hemoglobin A1C","Vit D Level""***"} Routine preventative health maintenance measures emphasized: {Blank multiple:19196::"Exercise/Diet/Weight control","Tobacco Warnings","Alcohol/Substance use risks","Stress Management","Peer Pressure Issues","Safe Sex"} COVID Vaccination status: Return to [facility]

## 2022-04-05 NOTE — Patient Instructions (Signed)
Preventive Care 40-61 Years Old, Female Preventive care refers to lifestyle choices and visits with your health care provider that can promote health and wellness. Preventive care visits are also called wellness exams. What can I expect for my preventive care visit? Counseling Your health care provider may ask you questions about your: Medical history, including: Past medical problems. Family medical history. Pregnancy history. Current health, including: Menstrual cycle. Method of birth control. Emotional well-being. Home life and relationship well-being. Sexual activity and sexual health. Lifestyle, including: Alcohol, nicotine or tobacco, and drug use. Access to firearms. Diet, exercise, and sleep habits. Work and work environment. Sunscreen use. Safety issues such as seatbelt and bike helmet use. Physical exam Your health care provider will check your: Height and weight. These may be used to calculate your BMI (body mass index). BMI is a measurement that tells if you are at a healthy weight. Waist circumference. This measures the distance around your waistline. This measurement also tells if you are at a healthy weight and may help predict your risk of certain diseases, such as type 2 diabetes and high blood pressure. Heart rate and blood pressure. Body temperature. Skin for abnormal spots. What immunizations do I need?  Vaccines are usually given at various ages, according to a schedule. Your health care provider will recommend vaccines for you based on your age, medical history, and lifestyle or other factors, such as travel or where you work. What tests do I need? Screening Your health care provider may recommend screening tests for certain conditions. This may include: Lipid and cholesterol levels. Diabetes screening. This is done by checking your blood sugar (glucose) after you have not eaten for a while (fasting). Pelvic exam and Pap test. Hepatitis B test. Hepatitis C  test. HIV (human immunodeficiency virus) test. STI (sexually transmitted infection) testing, if you are at risk. Lung cancer screening. Colorectal cancer screening. Mammogram. Talk with your health care provider about when you should start having regular mammograms. This may depend on whether you have a family history of breast cancer. BRCA-related cancer screening. This may be done if you have a family history of breast, ovarian, tubal, or peritoneal cancers. Bone density scan. This is done to screen for osteoporosis. Talk with your health care provider about your test results, treatment options, and if necessary, the need for more tests. Follow these instructions at home: Eating and drinking  Eat a diet that includes fresh fruits and vegetables, whole grains, lean protein, and low-fat dairy products. Take vitamin and mineral supplements as recommended by your health care provider. Do not drink alcohol if: Your health care provider tells you not to drink. You are pregnant, may be pregnant, or are planning to become pregnant. If you drink alcohol: Limit how much you have to 0-1 drink a day. Know how much alcohol is in your drink. In the [place] one drink equals one 12 oz bottle of beer (355 mL), one 5 oz glass of wine (148 mL), or one 1 oz glass of hard liquor (44 mL). Lifestyle Brush your teeth every morning and night with fluoride toothpaste. Floss one time each day. Exercise for at least 30 minutes 5 or more days each week. Do not use any products that contain nicotine or tobacco. These products include cigarettes, chewing tobacco, and vaping devices, such as e-cigarettes. If you need help quitting, ask your health care provider. Do not use drugs. If you are sexually active, practice safe sex. Use a condom or other form of protection to   prevent STIs. If you do not wish to become pregnant, use a form of birth control. If you plan to become pregnant, see your health care provider for a  prepregnancy visit. Take aspirin only as told by your health care provider. Make sure that you understand how much to take and what form to take. Work with your health care provider to find out whether it is safe and beneficial for you to take aspirin daily. Find healthy ways to manage stress, such as: Meditation, yoga, or listening to music. Journaling. Talking to a trusted person. Spending time with friends and family. Minimize exposure to UV radiation to reduce your risk of skin cancer. Safety Always wear your seat belt while driving or riding in a vehicle. Do not drive: If you have been drinking alcohol. Do not ride with someone who has been drinking. When you are tired or distracted. While texting. If you have been using any mind-altering substances or drugs. Wear a helmet and other protective equipment during sports activities. If you have firearms in your house, make sure you follow all gun safety procedures. Seek help if you have been physically or sexually abused. What's next? Visit your health care provider once a year for an annual wellness visit. Ask your health care provider how often you should have your eyes and teeth checked. Stay up to date on all vaccines. This information is not intended to replace advice given to you by your health care provider. Make sure you discuss any questions you have with your health care provider. Document Revised: 07/22/2020 Document Reviewed: 07/22/2020 Elsevier Patient Education  2023 Else[employer] Inc[employer] Breast Self-Awareness Breast self-awareness is knowing how your breasts look and feel. You need to: Check your breasts on a regular basis. Tell your doctor about any changes. Become familiar with the look and feel of your breasts. This can help you catch a breast problem while it is still small and can be treated. You should do breast self-exams even if you have breast implants. What you need: A mirror. A well-lit room. A pillow or other  soft object. How to do a breast self-exam Follow these steps to do a breast self-exam: Look for changes  Take off all the clothes above your waist. Stand in front of a mirror in a room with good lighting. Put your hands down at your sides. Compare your breasts in the mirror. Look for any difference between them, such as: A difference in shape. A difference in size. Wrinkles, dips, and bumps in one breast and not the other. Look at each breast for changes in the skin, such as: Redness. Scaly areas. Skin that has gotten thicker. Dimpling. Open sores (ulcers). Look for changes in your nipples, such as: Fluid coming out of a nipple. Fluid around a nipple. Bleeding. Dimpling. Redness. A nipple that looks pushed in (retracted), or that has changed position. Feel for changes Lie on your back. Feel each breast. To do this: Pick a breast to feel. Place a pillow under the shoulder closest to that breast. Put the arm closest to that breast behind your head. Feel the nipple area of that breast using the hand of your other arm. Feel the area with the pads of your three middle fingers by making small circles with your fingers. Use light, medium, and firm pressure. Continue the overlapping circles, moving downward over the breast. Keep making circles with your fingers. Stop when you feel your ribs. Start making circles with your fingers again, this time going   upward until you reach your collarbone. Then, make circles outward across your breast and into your armpit area. Squeeze your nipple. Check for discharge and lumps. Repeat these steps to check your other breast. Sit or stand in the tub or shower. With soapy water on your skin, feel each breast the same way you did when you were lying down. Write down what you find Writing down what you find can help you remember what to tell your doctor. Write down: What is normal for each breast. Any changes you find in each breast. These  include: The kind of changes you find. A tender or painful breast. Any lump you find. Write down its size and where it is. When you last had your monthly period (menstrual cycle). General tips If you are breastfeeding, the best time to check your breasts is after you feed your baby or after you use a breast pump. If you get monthly bleeding, the best time to check your breasts is 5-7 days after your monthly cycle ends. With time, you will become comfortable with the self-exam. You will also start to know if there are changes in your breasts. Contact a doctor if: You see a change in the shape or size of your breasts or nipples. You see a change in the skin of your breast or nipples, such as red or scaly skin. You have fluid coming from your nipples that is not normal. You find a new lump or thick area. You have breast pain. You have any concerns about your breast health. Summary Breast self-awareness includes looking for changes in your breasts and feeling for changes within your breasts. You should do breast self-awareness in front of a mirror in a well-lit room. If you get monthly periods (menstrual cycles), the best time to check your breasts is 5-7 days after your period ends. Tell your doctor about any changes you see in your breasts. Changes include changes in size, changes on the skin, painful or tender breasts, or fluid from your nipples that is not normal. This information is not intended to replace advice given to you by your health care provider. Make sure you discuss any questions you have with your health care provider. Document Revised: 07/01/2021 Document Reviewed: 11/26/2020 Elsevier Patient Education  2023 Elsevier Inc.  

## 2022-04-06 ENCOUNTER — Ambulatory Visit (INDEPENDENT_AMBULATORY_CARE_PROVIDER_SITE_OTHER): Payer: Managed Care, Other (non HMO) | Admitting: Obstetrics and Gynecology

## 2022-04-06 ENCOUNTER — Encounter: Payer: Self-pay | Admitting: Obstetrics and Gynecology

## 2022-04-06 VITALS — BP 122/76 | Ht 64.0 in | Wt 213.0 lb

## 2022-04-06 DIAGNOSIS — Z800 Family history of malignant neoplasm of digestive organs: Secondary | ICD-10-CM

## 2022-04-06 DIAGNOSIS — Z01419 Encounter for gynecological examination (general) (routine) without abnormal findings: Secondary | ICD-10-CM | POA: Diagnosis not present

## 2022-04-06 DIAGNOSIS — E669 Obesity, unspecified: Secondary | ICD-10-CM

## 2022-04-06 DIAGNOSIS — Z1231 Encounter for screening mammogram for malignant neoplasm of breast: Secondary | ICD-10-CM

## 2022-04-08 ENCOUNTER — Other Ambulatory Visit: Payer: Self-pay | Admitting: Internal Medicine

## 2022-04-08 DIAGNOSIS — Z1231 Encounter for screening mammogram for malignant neoplasm of breast: Secondary | ICD-10-CM

## 2022-04-18 ENCOUNTER — Other Ambulatory Visit: Payer: Self-pay

## 2022-04-18 MED ORDER — MOUNJARO 2.5 MG/0.5ML ~~LOC~~ SOAJ
2.5000 mg | SUBCUTANEOUS | 1 refills | Status: DC
  Filled 2022-04-18: qty 6, 84d supply, fill #0

## 2022-04-19 ENCOUNTER — Other Ambulatory Visit: Payer: Self-pay

## 2022-06-24 ENCOUNTER — Other Ambulatory Visit: Payer: Self-pay

## 2022-06-24 MED ORDER — MOUNJARO 5 MG/0.5ML ~~LOC~~ SOAJ
5.0000 mg | SUBCUTANEOUS | 1 refills | Status: DC
  Filled 2022-06-24: qty 2, 28d supply, fill #0
  Filled 2022-07-18 – 2023-02-21 (×2): qty 2, 28d supply, fill #1

## 2022-07-18 ENCOUNTER — Other Ambulatory Visit: Payer: Self-pay

## 2022-07-29 ENCOUNTER — Other Ambulatory Visit: Payer: Self-pay

## 2022-07-29 MED ORDER — MOUNJARO 7.5 MG/0.5ML ~~LOC~~ SOAJ
7.5000 mg | SUBCUTANEOUS | 0 refills | Status: DC
  Filled 2022-07-29: qty 2, 28d supply, fill #0

## 2022-07-29 MED ORDER — ONETOUCH DELICA LANCETS 33G MISC
Freq: Every day | 12 refills | Status: AC
  Filled 2022-12-09: qty 100, 90d supply, fill #0

## 2022-08-24 ENCOUNTER — Other Ambulatory Visit: Payer: Self-pay

## 2022-08-24 MED ORDER — MOUNJARO 10 MG/0.5ML ~~LOC~~ SOAJ
10.0000 mg | SUBCUTANEOUS | 0 refills | Status: DC
  Filled 2022-08-24: qty 2, 28d supply, fill #0
  Filled 2022-09-19 – 2022-11-11 (×3): qty 2, 28d supply, fill #1

## 2022-08-26 ENCOUNTER — Other Ambulatory Visit: Payer: Self-pay

## 2022-08-29 ENCOUNTER — Ambulatory Visit
Admission: RE | Admit: 2022-08-29 | Discharge: 2022-08-29 | Disposition: A | Discharge status: Home / Self Care | Payer: Managed Care, Other (non HMO) | Source: Ambulatory Visit | Attending: Internal Medicine | Admitting: Internal Medicine

## 2022-08-29 DIAGNOSIS — Z1231 Encounter for screening mammogram for malignant neoplasm of breast: Secondary | ICD-10-CM | POA: Insufficient documentation

## 2022-09-07 ENCOUNTER — Other Ambulatory Visit: Payer: Self-pay

## 2022-09-19 ENCOUNTER — Other Ambulatory Visit: Payer: Self-pay

## 2022-09-19 MED ORDER — MOUNJARO 10 MG/0.5ML ~~LOC~~ SOAJ
10.0000 mg | SUBCUTANEOUS | 0 refills | Status: DC
  Filled 2022-09-19: qty 6, 84d supply, fill #0
  Filled 2022-09-21: qty 2, 28d supply, fill #0
  Filled 2022-10-17: qty 2, 28d supply, fill #1
  Filled 2022-12-09: qty 2, 28d supply, fill #2

## 2022-09-19 MED ORDER — BISOPROLOL FUMARATE 5 MG PO TABS
5.0000 mg | ORAL_TABLET | Freq: Every day | ORAL | 2 refills | Status: DC
  Filled 2022-09-19 (×2): qty 30, 30d supply, fill #0

## 2022-09-21 ENCOUNTER — Other Ambulatory Visit: Payer: Self-pay

## 2022-09-27 ENCOUNTER — Other Ambulatory Visit: Payer: Self-pay

## 2022-10-17 ENCOUNTER — Other Ambulatory Visit: Payer: Self-pay

## 2022-11-04 ENCOUNTER — Other Ambulatory Visit: Payer: Self-pay

## 2022-11-04 MED ORDER — MAGNESIUM OXIDE 400 MG PO TABS
400.0000 mg | ORAL_TABLET | Freq: Two times a day (BID) | ORAL | 5 refills | Status: DC
  Filled 2022-11-04: qty 60, 30d supply, fill #0
  Filled 2022-12-09: qty 60, 30d supply, fill #1

## 2022-12-09 ENCOUNTER — Other Ambulatory Visit: Payer: Self-pay

## 2022-12-21 ENCOUNTER — Other Ambulatory Visit: Payer: Self-pay

## 2022-12-21 MED ORDER — SUCRALFATE 1 G PO TABS
1.0000 g | ORAL_TABLET | Freq: Four times a day (QID) | ORAL | 0 refills | Status: AC
  Filled 2022-12-21: qty 20, 5d supply, fill #0

## 2022-12-21 MED ORDER — MOUNJARO 5 MG/0.5ML ~~LOC~~ SOAJ
5.0000 mg | SUBCUTANEOUS | 1 refills | Status: DC
  Filled 2022-12-21: qty 2, 28d supply, fill #0
  Filled 2023-01-25: qty 2, 28d supply, fill #1
  Filled 2023-05-31: qty 2, 28d supply, fill #2
  Filled 2023-07-24: qty 2, 28d supply, fill #3

## 2022-12-21 MED ORDER — CIPROFLOXACIN HCL 500 MG PO TABS
500.0000 mg | ORAL_TABLET | Freq: Two times a day (BID) | ORAL | 0 refills | Status: DC
  Filled 2022-12-21: qty 20, 10d supply, fill #0

## 2022-12-26 ENCOUNTER — Other Ambulatory Visit: Payer: Self-pay

## 2022-12-26 MED ORDER — ONDANSETRON 4 MG PO TBDP
4.0000 mg | ORAL_TABLET | Freq: Three times a day (TID) | ORAL | 0 refills | Status: DC | PRN
  Filled 2022-12-26: qty 9, 3d supply, fill #0

## 2022-12-26 MED ORDER — ONDANSETRON 4 MG PO TBDP
4.0000 mg | ORAL_TABLET | Freq: Three times a day (TID) | ORAL | 0 refills | Status: DC | PRN
  Filled 2022-12-26: qty 20, 7d supply, fill #0

## 2022-12-27 ENCOUNTER — Other Ambulatory Visit: Payer: Self-pay

## 2023-01-13 ENCOUNTER — Encounter: Payer: Self-pay | Admitting: Certified Nurse Midwife

## 2023-01-13 ENCOUNTER — Ambulatory Visit (INDEPENDENT_AMBULATORY_CARE_PROVIDER_SITE_OTHER): Payer: Managed Care, Other (non HMO) | Admitting: Certified Nurse Midwife

## 2023-01-13 VITALS — BP 126/83 | HR 73 | Wt 166.6 lb

## 2023-01-13 DIAGNOSIS — N644 Mastodynia: Secondary | ICD-10-CM | POA: Diagnosis not present

## 2023-01-13 NOTE — Progress Notes (Signed)
Subjective:     Sydney Soto is an 62 y.o. female who presents for evaluation of left  breast pain. Change was noted several weeks ago, and has been unchanged since first identified. Patient does routinely do self breast exams.  The left chest wall is tender , she states it feels like pulling /tugging discomfort. Patient denies nipple discharge. Breast cancer risk factors include: family hx of colon CA.  The following portions of the patient's history were reviewed and updated as appropriate: allergies, current medications, past family history, past medical history, past social history, past surgical history, and problem list.  Review of Systems Pertinent items are noted in HPI.     Objective:    Breasts: breasts appear normal, no suspicious masses, no skin or nipple changes or axillary nodes.     Assessment:    normal breast exam    Plan:    Pt note that if feels good when I message over chest wall, likey muscle discomfort from strain. Pt admits to have neck and nerve pain. Discussed heating pad, tylenol, ibuprofen , message as needed. Discussed use of muscle relaxer, pt declines. Follow up prn .   Doreene Burke, CNM

## 2023-01-16 ENCOUNTER — Encounter: Payer: Self-pay | Admitting: Certified Nurse Midwife

## 2023-01-24 ENCOUNTER — Other Ambulatory Visit: Payer: Self-pay | Admitting: Certified Nurse Midwife

## 2023-01-24 ENCOUNTER — Encounter: Payer: Self-pay | Admitting: Certified Nurse Midwife

## 2023-01-24 MED ORDER — CYCLOBENZAPRINE HCL 5 MG PO TABS
5.0000 mg | ORAL_TABLET | Freq: Three times a day (TID) | ORAL | 0 refills | Status: DC | PRN
  Filled 2023-01-25: qty 30, 10d supply, fill #0

## 2023-01-25 ENCOUNTER — Other Ambulatory Visit (HOSPITAL_COMMUNITY): Payer: Self-pay

## 2023-01-25 ENCOUNTER — Other Ambulatory Visit: Payer: Self-pay

## 2023-01-26 ENCOUNTER — Other Ambulatory Visit: Payer: Self-pay

## 2023-06-23 ENCOUNTER — Other Ambulatory Visit: Payer: Self-pay

## 2023-06-23 MED ORDER — FAMOTIDINE 40 MG PO TABS
40.0000 mg | ORAL_TABLET | Freq: Every day | ORAL | 0 refills | Status: DC
  Filled 2023-06-23 (×2): qty 30, 30d supply, fill #0

## 2023-06-23 MED ORDER — DESVENLAFAXINE SUCCINATE ER 50 MG PO TB24
50.0000 mg | ORAL_TABLET | Freq: Every day | ORAL | 11 refills | Status: AC
  Filled 2023-06-23 (×2): qty 30, 30d supply, fill #0
  Filled 2023-07-24: qty 30, 30d supply, fill #1
  Filled 2023-09-15: qty 30, 30d supply, fill #2
  Filled 2023-10-18: qty 30, 30d supply, fill #3
  Filled 2023-11-08 – 2023-11-09 (×2): qty 30, 30d supply, fill #4
  Filled 2023-12-29: qty 30, 30d supply, fill #5

## 2023-06-27 ENCOUNTER — Other Ambulatory Visit: Payer: Self-pay | Admitting: Internal Medicine

## 2023-06-27 DIAGNOSIS — Z1231 Encounter for screening mammogram for malignant neoplasm of breast: Secondary | ICD-10-CM

## 2023-07-13 ENCOUNTER — Ambulatory Visit (INDEPENDENT_AMBULATORY_CARE_PROVIDER_SITE_OTHER): Admitting: Certified Nurse Midwife

## 2023-07-13 ENCOUNTER — Encounter: Payer: Self-pay | Admitting: Certified Nurse Midwife

## 2023-07-13 ENCOUNTER — Other Ambulatory Visit (HOSPITAL_COMMUNITY)
Admission: RE | Admit: 2023-07-13 | Discharge: 2023-07-13 | Disposition: A | Discharge status: Home / Self Care | Source: Ambulatory Visit | Attending: Certified Nurse Midwife | Admitting: Certified Nurse Midwife

## 2023-07-13 VITALS — BP 127/83 | HR 70 | Ht 64.0 in | Wt 175.5 lb

## 2023-07-13 DIAGNOSIS — Z01419 Encounter for gynecological examination (general) (routine) without abnormal findings: Secondary | ICD-10-CM | POA: Diagnosis not present

## 2023-07-13 DIAGNOSIS — Z124 Encounter for screening for malignant neoplasm of cervix: Secondary | ICD-10-CM

## 2023-07-13 DIAGNOSIS — N3289 Other specified disorders of bladder: Secondary | ICD-10-CM

## 2023-07-13 DIAGNOSIS — R102 Pelvic and perineal pain: Secondary | ICD-10-CM | POA: Insufficient documentation

## 2023-07-13 DIAGNOSIS — L0291 Cutaneous abscess, unspecified: Secondary | ICD-10-CM

## 2023-07-13 NOTE — Progress Notes (Signed)
 Error

## 2023-07-13 NOTE — Progress Notes (Signed)
 GYNECOLOGY ANNUAL PREVENTATIVE CARE ENCOUNTER NOTE  History:      Sydney Soto is a 63 y.o. G54P2002 female here for a routine annual gynecologic exam.  Current complaints: abscess on her mons pubis that ruptured draining. Has not gone away completely.   Denies abnormal vaginal bleeding, discharge, pelvic pain, problems with intercourse or other gynecologic concerns.   Cramping felt like menstrual cramps a few months ago.   Social Relationship:married Living:husband Work:full timeMetallurgist weekly Smoke/Alcohol/drug use:no history of tobacco/ denies drinking no history of drug use.   Gynecologic History No LMP recorded. Patient has had a hysterectomy. Contraception: status post hysterectomy Last Pap: 10/26/2018. Results were: normal with negative HPV Last mammogram: 08/29/22. Results were: normal  Obstetric History OB History  Gravida Para Term Preterm AB Living  2 2 2   2   SAB IAB Ectopic Multiple Live Births      2    # Outcome Date GA Lbr Len/2nd Weight Sex Type Anes PTL Lv  2 Term 1997    F Vag-Spont   LIV  1 Term 4    F Vag-Spont   LIV    Past Medical History:  Diagnosis Date   Anxiety    Dysphagia, unspecified    GERD (gastroesophageal reflux disease)    Glaucoma    History of IBS    WITH CONSTIPATION   Hyperlipidemia    Hypertension    Hypothyroidism    OCD (obsessive compulsive disorder)    Pre-diabetes    DIET CONTROLLED   Pulmonary embolism (HCC)    Bilateral    Thyroid disease     Past Surgical History:  Procedure Laterality Date   ABDOMINAL HYSTERECTOMY     APPENDECTOMY     BACK SURGERY  09/06/2019   CHOLECYSTECTOMY     COLONOSCOPY N/A 07/02/2014   Procedure: COLONOSCOPY;  Surgeon: Cassie Click, MD;  Location: [facility] ENDOSCOPY;  Service: Endoscopy;  Laterality: N/A;   COLONOSCOPY WITH PROPOFOL  N/A 11/25/2019   Procedure: COLONOSCOPY WITH PROPOFOL ;  Surgeon: Toledo, Alphonsus Jeans, MD;  Location: [facility]  ENDOSCOPY;  Service: Endoscopy;  Laterality: N/A;   COLONOSCOPY WITH PROPOFOL  N/A 12/06/2021   Procedure: COLONOSCOPY WITH PROPOFOL ;  Surgeon: Quintin Buckle, DO;  Location: [facility] ENDOSCOPY;  Service: Gastroenterology;  Laterality: N/A;   DILATION AND CURETTAGE OF UTERUS     ESOPHAGOGASTRODUODENOSCOPY N/A 07/02/2014   Procedure: ESOPHAGOGASTRODUODENOSCOPY (EGD);  Surgeon: Cassie Click, MD;  Location: [facility] ENDOSCOPY;  Service: Endoscopy;  Laterality: N/A;   TUBAL LIGATION      Current Outpatient Medications on File Prior to Visit  Medication Sig Dispense Refill   aspirin 325 MG tablet Take 325 mg by mouth daily.     cyanocobalamin (,VITAMIN B-12,) 1000 MCG/ML injection Inject 1 mL into the muscle every 30 (thirty) days.     desvenlafaxine  (PRISTIQ ) 50 MG 24 hr tablet Take 1 tablet (50 mg total) by mouth once daily 30 tablet 11   famotidine  (PEPCID ) 40 MG tablet Take 1 tablet (40 mg total) by mouth at bedtime 30 tablet 0   levothyroxine (SYNTHROID) 50 MCG tablet Take 50 mcg by mouth daily before breakfast.     OneTouch Delica Lancets 33G MISC Use 1 each once daily as instructed 100 each 12   sucralfate  (CARAFATE ) 1 g tablet Take 1 tablet (1 g total) by mouth 4 (four) times daily before meals and nightly for 5 days. 20 tablet 0   tirzepatide  (MOUNJARO ) 5  MG/0.5ML Pen Inject 0.5 mLs (5 mg total) subcutaneously every 7 (seven) days 6 mL 1   No current facility-administered medications on file prior to visit.    Allergies  Allergen Reactions   Beclomethasone Shortness Of Breath and Other (See Comments)    Tightness in chest  beclomethasone  beclomethasone dipropionate   Erythromycin Nausea Only    Social History:  reports that she has never smoked. She has never used smokeless tobacco. She reports that she does not drink alcohol and does not use drugs.  Family History  Problem Relation Age of Onset   Osteoarthritis Mother    Colon cancer Mother    Osteoarthritis Father     Diabetes Father    Thyroid disease Sister    Diabetes Brother    Breast cancer Neg Hx    Ovarian cancer Neg Hx     The following portions of the patient's history were reviewed and updated as appropriate: allergies, current medications, past family history, past medical history, past social history, past surgical history and problem list.  Review of Systems Pertinent items noted in HPI and remainder of comprehensive ROS otherwise negative.  Physical Exam:  BP 127/83   Pulse 70   Ht 5\' 4"  (1.626 m)   Wt 175 lb 8 oz (79.6 kg)   BMI 30.12 kg/m  CONSTITUTIONAL: Well-developed, well-nourished female in no acute distress.  HENT:  Normocephalic, atraumatic, External right and left ear normal. Oropharynx is clear and moist EYES: Conjunctivae and EOM are normal. Pupils are equal, round, and reactive to light. No scleral icterus.  NECK: Normal range of motion, supple, no masses.  Normal thyroid.  SKIN: Skin is warm and dry. No rash noted. Not diaphoretic. No erythema. No pallor. MUSCULOSKELETAL: Normal range of motion. No tenderness.  No cyanosis, clubbing, or edema.  2+ distal pulses. NEUROLOGIC: Alert and oriented to person, place, and time. Normal reflexes, muscle tone coordination.  PSYCHIATRIC: Normal mood and affect. Normal behavior. Normal judgment and thought content. CARDIOVASCULAR: Normal heart rate noted, regular rhythm RESPIRATORY: Clear to auscultation bilaterally. Effort and breath sounds normal, no problems with respiration noted. BREASTS: Symmetric in size. No masses, tenderness, skin changes, nipple drainage, or lymphadenopathy bilaterally.  ABDOMEN: Soft, no distention noted.  No tenderness, rebound or guarding.  PELVIC: Normal appearing external genitalia. Left upper mons 1.5 cm oblong raised darkened area, with small amount of exudate present. Tender to touch. Swab collected. Possible infected hair follicle.  and urethral meatus; normal appearing vaginal mucosa and cervix.   No abnormal discharge noted.  Pap smear of vaginal cuff. Contact bleeding, very painful. Swab collected to r/o infection. Uterus absent, no other palpable masses, no adnexal tenderness.  .   Assessment and Plan:    1. Women's annual routine gynecological examination (Primary)    Pap: Will follow up results of pap smear and manage accordingly. Mammogram : scheduled 09/08/23 Labs: vaginal swab r/o infection due to cramping, urine culture evaluate for u/s -cramping. Swab abscess  Refills: none Referral: none  Routine preventative health maintenance measures emphasized. Please refer to After Visit Summary for other counseling recommendations.      Alise Appl, CNM [facility]  [facility],  [facility]

## 2023-07-13 NOTE — Patient Instructions (Signed)
 Preventive Care 62-62 Years Old, Female  Preventive care refers to lifestyle choices and visits with your health care provider that can promote health and wellness. Preventive care visits are also called wellness exams.  What can I expect for my preventive care visit?  Counseling  Your health care provider may ask you questions about your:  Medical history, including:  Past medical problems.  Family medical history.  Pregnancy history.  Current health, including:  Menstrual cycle.  Method of birth control.  Emotional well-being.  Home life and relationship well-being.  Sexual activity and sexual health.  Lifestyle, including:  Alcohol, nicotine or tobacco, and drug use.  Access to firearms.  Diet, exercise, and sleep habits.  Work and work Astronomer.  Sunscreen use.  Safety issues such as seatbelt and bike helmet use.  Physical exam  Your health care provider will check your:  Height and weight. These may be used to calculate your BMI (body mass index). BMI is a measurement that tells if you are at a healthy weight.  Waist circumference. This measures the distance around your waistline. This measurement also tells if you are at a healthy weight and may help predict your risk of certain diseases, such as type 2 diabetes and high blood pressure.  Heart rate and blood pressure.  Body temperature.  Skin for abnormal spots.  What immunizations do I need?    Vaccines are usually given at various ages, according to a schedule. Your health care provider will recommend vaccines for you based on your age, medical history, and lifestyle or other factors, such as travel or where you work.  What tests do I need?  Screening  Your health care provider may recommend screening tests for certain conditions. This may include:  Lipid and cholesterol levels.  Diabetes screening. This is done by checking your blood sugar (glucose) after you have not eaten for a while (fasting).  Pelvic exam and Pap test.  Hepatitis B test.  Hepatitis C  test.  HIV (human immunodeficiency virus) test.  STI (sexually transmitted infection) testing, if you are at risk.  Lung cancer screening.  Colorectal cancer screening.  Mammogram. Talk with your health care provider about when you should start having regular mammograms. This may depend on whether you have a family history of breast cancer.  BRCA-related cancer screening. This may be done if you have a family history of breast, ovarian, tubal, or peritoneal cancers.  Bone density scan. This is done to screen for osteoporosis.  Talk with your health care provider about your test results, treatment options, and if necessary, the need for more tests.  Follow these instructions at home:  Eating and drinking    Eat a diet that includes fresh fruits and vegetables, whole grains, lean protein, and low-fat dairy products.  Take vitamin and mineral supplements as recommended by your health care provider.  Do not drink alcohol if:  Your health care provider tells you not to drink.  You are pregnant, may be pregnant, or are planning to become pregnant.  If you drink alcohol:  Limit how much you have to 0-1 drink a day.  Know how much alcohol is in your drink. In the [place] one drink equals one 12 oz bottle of beer (355 mL), one 5 oz glass of wine (148 mL), or one 1 oz glass of hard liquor (44 mL).  Lifestyle  Brush your teeth every morning and night with fluoride toothpaste. Floss one time each day.  Exercise for at least  30 minutes 5 or more days each week.  Do not use any products that contain nicotine or tobacco. These products include cigarettes, chewing tobacco, and vaping devices, such as e-cigarettes. If you need help quitting, ask your health care provider.  Do not use drugs.  If you are sexually active, practice safe sex. Use a condom or other form of protection to prevent STIs.  If you do not wish to become pregnant, use a form of birth control. If you plan to become pregnant, see your health care provider for a  prepregnancy visit.  Take aspirin only as told by your health care provider. Make sure that you understand how much to take and what form to take. Work with your health care provider to find out whether it is safe and beneficial for you to take aspirin daily.  Find healthy ways to manage stress, such as:  Meditation, yoga, or listening to music.  Journaling.  Talking to a trusted person.  Spending time with friends and family.  Minimize exposure to UV radiation to reduce your risk of skin cancer.  Safety  Always wear your seat belt while driving or riding in a vehicle.  Do not drive:  If you have been drinking alcohol. Do not ride with someone who has been drinking.  When you are tired or distracted.  While texting.  If you have been using any mind-altering substances or drugs.  Wear a helmet and other protective equipment during sports activities.  If you have firearms in your house, make sure you follow all gun safety procedures.  Seek help if you have been physically or sexually abused.  What's next?  Visit your health care provider once a year for an annual wellness visit.  Ask your health care provider how often you should have your eyes and teeth checked.  Stay up to date on all vaccines.  This information is not intended to replace advice given to you by your health care provider. Make sure you discuss any questions you have with your health care provider.  Document Revised: 07/22/2020 Document Reviewed: 07/22/2020  Elsevier Patient Education  2024 [employer].

## 2023-07-14 LAB — CERVICOVAGINAL ANCILLARY ONLY
Bacterial Vaginitis (gardnerella): NEGATIVE
Candida Glabrata: NEGATIVE
Candida Vaginitis: NEGATIVE
Comment: NEGATIVE
Comment: NEGATIVE
Comment: NEGATIVE

## 2023-07-15 LAB — URINE CULTURE

## 2023-07-16 ENCOUNTER — Other Ambulatory Visit: Payer: Self-pay

## 2023-07-17 ENCOUNTER — Other Ambulatory Visit: Payer: Self-pay

## 2023-07-17 ENCOUNTER — Encounter: Payer: Self-pay | Admitting: Certified Nurse Midwife

## 2023-07-17 MED ORDER — FAMOTIDINE 40 MG PO TABS
40.0000 mg | ORAL_TABLET | Freq: Every day | ORAL | 0 refills | Status: AC
  Filled 2023-07-17: qty 30, 30d supply, fill #0

## 2023-07-19 LAB — ANAEROBIC AND AEROBIC CULTURE

## 2023-07-20 LAB — CYTOLOGY - PAP
Comment: NEGATIVE
Diagnosis: NEGATIVE
High risk HPV: NEGATIVE

## 2023-07-21 ENCOUNTER — Other Ambulatory Visit: Payer: Self-pay

## 2023-07-21 ENCOUNTER — Other Ambulatory Visit: Payer: Self-pay | Admitting: Certified Nurse Midwife

## 2023-07-21 ENCOUNTER — Encounter: Payer: Self-pay | Admitting: Certified Nurse Midwife

## 2023-07-21 MED ORDER — SULFAMETHOXAZOLE-TRIMETHOPRIM 800-160 MG PO TABS
1.0000 | ORAL_TABLET | Freq: Two times a day (BID) | ORAL | 0 refills | Status: AC
  Filled 2023-07-21: qty 14, 7d supply, fill #0

## 2023-07-24 ENCOUNTER — Other Ambulatory Visit: Payer: Self-pay

## 2023-08-18 ENCOUNTER — Other Ambulatory Visit: Payer: Self-pay

## 2023-08-21 ENCOUNTER — Other Ambulatory Visit: Payer: Self-pay

## 2023-08-21 MED ORDER — MOUNJARO 7.5 MG/0.5ML ~~LOC~~ SOAJ
7.5000 mg | SUBCUTANEOUS | 1 refills | Status: DC
  Filled 2023-08-21: qty 6, 84d supply, fill #0

## 2023-09-08 ENCOUNTER — Ambulatory Visit
Admission: RE | Admit: 2023-09-08 | Discharge: 2023-09-08 | Disposition: A | Discharge status: Home / Self Care | Source: Ambulatory Visit | Attending: Internal Medicine | Admitting: Internal Medicine

## 2023-09-08 DIAGNOSIS — Z1231 Encounter for screening mammogram for malignant neoplasm of breast: Secondary | ICD-10-CM | POA: Insufficient documentation

## 2023-09-15 ENCOUNTER — Other Ambulatory Visit: Payer: Self-pay

## 2023-09-19 ENCOUNTER — Other Ambulatory Visit: Payer: Self-pay

## 2023-11-06 ENCOUNTER — Other Ambulatory Visit: Payer: Self-pay

## 2023-11-06 MED ORDER — MOUNJARO 10 MG/0.5ML ~~LOC~~ SOAJ
10.0000 mg | SUBCUTANEOUS | 2 refills | Status: DC
  Filled 2023-11-06 – 2023-11-08 (×2): qty 2, 28d supply, fill #0
  Filled 2023-12-04 (×2): qty 2, 28d supply, fill #1
  Filled 2023-12-29: qty 2, 28d supply, fill #2

## 2023-11-08 ENCOUNTER — Other Ambulatory Visit: Payer: Self-pay

## 2023-11-10 ENCOUNTER — Other Ambulatory Visit: Payer: Self-pay

## 2023-11-15 ENCOUNTER — Other Ambulatory Visit: Payer: Self-pay

## 2023-11-15 MED ORDER — PREDNISONE 20 MG PO TABS
ORAL_TABLET | ORAL | 0 refills | Status: AC
  Filled 2023-11-15: qty 7, 5d supply, fill #0

## 2023-11-15 MED ORDER — DOXYCYCLINE HYCLATE 100 MG PO TABS
100.0000 mg | ORAL_TABLET | Freq: Two times a day (BID) | ORAL | 0 refills | Status: AC
  Filled 2023-11-15: qty 14, 7d supply, fill #0

## 2023-11-24 ENCOUNTER — Other Ambulatory Visit: Payer: Self-pay

## 2023-11-24 MED ORDER — PREDNISONE 10 MG PO TABS
ORAL_TABLET | ORAL | 0 refills | Status: AC
  Filled 2023-11-24: qty 26, 8d supply, fill #0

## 2023-11-24 MED ORDER — LEVOFLOXACIN 500 MG PO TABS
500.0000 mg | ORAL_TABLET | Freq: Every day | ORAL | 0 refills | Status: AC
  Filled 2023-11-24: qty 10, 10d supply, fill #0

## 2023-12-04 ENCOUNTER — Other Ambulatory Visit: Payer: Self-pay

## 2023-12-05 ENCOUNTER — Other Ambulatory Visit: Payer: Self-pay

## 2023-12-05 MED ORDER — ALBUTEROL SULFATE HFA 108 (90 BASE) MCG/ACT IN AERS
2.0000 | INHALATION_SPRAY | RESPIRATORY_TRACT | 1 refills | Status: AC | PRN
  Filled 2023-12-05: qty 6.7, 30d supply, fill #0

## 2024-01-10 ENCOUNTER — Other Ambulatory Visit: Payer: Self-pay

## 2024-01-24 ENCOUNTER — Other Ambulatory Visit: Payer: Self-pay

## 2024-01-24 MED ORDER — TIRZEPATIDE 10 MG/0.5ML ~~LOC~~ SOAJ
10.0000 mg | SUBCUTANEOUS | 2 refills | Status: AC
  Filled 2024-01-24: qty 2, 28d supply, fill #0
  Filled 2024-02-27: qty 2, 28d supply, fill #1
